# Patient Record
Sex: Male | Born: 1955
Health system: Southern US, Community
[De-identification: ages and names within clinical notes are randomized; demographics above are authoritative.]

## PROBLEM LIST (undated history)

## (undated) DIAGNOSIS — T4145XA Adverse effect of unspecified anesthetic, initial encounter: Secondary | ICD-10-CM

## (undated) DIAGNOSIS — T8859XA Other complications of anesthesia, initial encounter: Secondary | ICD-10-CM

## (undated) DIAGNOSIS — E119 Type 2 diabetes mellitus without complications: Secondary | ICD-10-CM

## (undated) DIAGNOSIS — I10 Essential (primary) hypertension: Secondary | ICD-10-CM

## (undated) HISTORY — PX: BACK SURGERY: SHX140

## (undated) HISTORY — PX: JOINT REPLACEMENT: SHX530

## (undated) HISTORY — PX: ELBOW SURGERY: SHX618

---
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
## 2011-03-16 ENCOUNTER — Encounter (HOSPITAL_COMMUNITY): Payer: Self-pay

## 2011-03-16 ENCOUNTER — Emergency Department (HOSPITAL_COMMUNITY)
Admission: EM | Admit: 2011-03-16 | Discharge: 2011-03-16 | Disposition: A | Discharge status: Home / Self Care | Payer: Medicare Other | Attending: Emergency Medicine | Admitting: Emergency Medicine

## 2011-03-16 DIAGNOSIS — R21 Rash and other nonspecific skin eruption: Secondary | ICD-10-CM | POA: Insufficient documentation

## 2011-03-16 MED ORDER — FLUOCINONIDE 0.05 % EX CREA: TOPICAL_CREAM | Freq: Two times a day (BID) | CUTANEOUS | Status: DC

## 2011-03-16 NOTE — ED Notes (Signed)
Pt c/o rash on nose and right side of face x years, on and off. No drainage, burning, and itchy.

## 2011-03-16 NOTE — ED Notes (Signed)
Pt in from home with rash to face states no relief with medication prescribed c/o burning and itching

## 2011-03-16 NOTE — ED Provider Notes (Signed)
History     CSN: [id number]  Arrival date & time 03/16/11  1302   First MD Initiated Contact with Patient 03/16/11 1355      Chief Complaint  Patient presents with  . Rash    (Consider location/radiation/quality/duration/timing/severity/associated sxs/prior treatment) HPI Comments: Patient reports that he has had similar rash in the past and has been seen by Dermatology in [place], [place].  At that time he was given fluocinomide topical solution, which he reports worked well.  However, he ran out of the solution and would like to have it refilled.  Patient is a 56 y.o. male presenting with rash. The history is provided by the patient.  Rash  This is a recurrent problem. There has been no fever. The rash is present on the face. The patient is experiencing no pain. Pertinent negatives include no blisters, no pain and no weeping.    History reviewed. No pertinent past medical history.  Past Surgical History  Procedure Date  . Back surgery   . Joint replacement     History reviewed. No pertinent family history.  History  Substance Use Topics  . Smoking status: Never Smoker   . Smokeless tobacco: Not on file  . Alcohol Use: No      Review of Systems  Constitutional: Negative for fever, chills and fatigue.  Respiratory: Negative for shortness of breath.   Cardiovascular: Negative for chest pain.  Gastrointestinal: Negative for nausea and vomiting.  Skin: Positive for rash.  Neurological: Negative for headaches.    Allergies  Review of patient's allergies indicates not on file.  Home Medications   Current Outpatient Rx  Name Route Sig Dispense Refill  . IBUPROFEN 200 MG PO TABS Oral Take 800 mg by mouth every 6 (six) hours as needed. For pain.      BP 128/76  Pulse 87  Temp(Src) 98.9 F (37.2 C) (Oral)  Resp 16  SpO2 100%  Physical Exam  Nursing note and vitals reviewed. Constitutional: He is oriented to person, place, and time. He appears  well-developed and well-nourished. No distress.  HENT:  Head: Normocephalic and atraumatic.       Hyperpigmented areas on the nose and inferior to both eyes.  Cardiovascular: Normal rate, regular rhythm and normal heart sounds.   Pulmonary/Chest: Effort normal and breath sounds normal.  Neurological: He is alert and oriented to person, place, and time.  Skin: Skin is warm and dry. He is not diaphoretic.  Psychiatric: He has a normal mood and affect.    ED Course  Procedures (including critical care time)  Labs Reviewed - No data to display No results found.   No diagnosis found.    MDM  Patient had been evaluated for similar rash in the past by Dermatology.  He was given Fluocinomide topical solution, which helped.  He reports that the rash today is no different than the rash in the past.  Therefore, patient discharged home and given Rx for Fluocinomide.        Pascal Lux [facility] 03/16/11 1622

## 2011-03-17 NOTE — ED Provider Notes (Signed)
Medical screening examination/treatment/procedure(s) were performed by non-physician practitioner and as supervising physician I was immediately available for consultation/collaboration.   Suzi Roots, MD 03/17/11 [phone]

## 2011-07-06 ENCOUNTER — Emergency Department (HOSPITAL_COMMUNITY)
Admission: EM | Admit: 2011-07-06 | Discharge: 2011-07-06 | Disposition: A | Discharge status: Home / Self Care | Payer: Medicare Other | Attending: Emergency Medicine | Admitting: Emergency Medicine

## 2011-07-06 ENCOUNTER — Encounter (HOSPITAL_COMMUNITY): Payer: Self-pay | Admitting: Emergency Medicine

## 2011-07-06 DIAGNOSIS — K5289 Other specified noninfective gastroenteritis and colitis: Secondary | ICD-10-CM | POA: Insufficient documentation

## 2011-07-06 DIAGNOSIS — R1013 Epigastric pain: Secondary | ICD-10-CM | POA: Insufficient documentation

## 2011-07-06 DIAGNOSIS — R197 Diarrhea, unspecified: Secondary | ICD-10-CM | POA: Insufficient documentation

## 2011-07-06 DIAGNOSIS — R1110 Vomiting, unspecified: Secondary | ICD-10-CM | POA: Insufficient documentation

## 2011-07-06 DIAGNOSIS — K529 Noninfective gastroenteritis and colitis, unspecified: Secondary | ICD-10-CM

## 2011-07-06 LAB — DIFFERENTIAL
Basophils Absolute: 0 10*3/uL (ref 0.0–0.1)
Basophils Relative: 0 % (ref 0–1)
Lymphocytes Relative: 18 % (ref 12–46)
Monocytes Absolute: 1.1 10*3/uL — ABNORMAL HIGH (ref 0.1–1.0)
Neutro Abs: 8.9 10*3/uL — ABNORMAL HIGH (ref 1.7–7.7)
Neutrophils Relative %: 72 % (ref 43–77)

## 2011-07-06 LAB — COMPREHENSIVE METABOLIC PANEL
ALT: 24 U/L (ref 0–53)
AST: 35 U/L (ref 0–37)
Albumin: 4.3 g/dL (ref 3.5–5.2)
Alkaline Phosphatase: 72 U/L (ref 39–117)
CO2: 26 mEq/L (ref 19–32)
Chloride: 100 mEq/L (ref 96–112)
GFR calc non Af Amer: 63 mL/min — ABNORMAL LOW (ref >90)
Potassium: 3.5 mEq/L (ref 3.5–5.1)
Sodium: 137 mEq/L (ref 135–145)
Total Bilirubin: 0.4 mg/dL (ref 0.3–1.2)

## 2011-07-06 LAB — URINE MICROSCOPIC-ADD ON

## 2011-07-06 LAB — CBC
HCT: 44.2 % (ref 39.0–52.0)
Platelets: 239 10*3/uL (ref 150–400)
RDW: 14.4 % (ref 11.5–15.5)
WBC: 12.2 10*3/uL — ABNORMAL HIGH (ref 4.0–10.5)

## 2011-07-06 LAB — URINALYSIS, ROUTINE W REFLEX MICROSCOPIC
Glucose, UA: NEGATIVE mg/dL
Leukocytes, UA: NEGATIVE
Protein, ur: NEGATIVE mg/dL
pH: 6 (ref 5.0–8.0)

## 2011-07-06 LAB — GLUCOSE, CAPILLARY: Glucose-Capillary: 112 mg/dL — ABNORMAL HIGH (ref 70–99)

## 2011-07-06 MED ORDER — ONDANSETRON 8 MG PO TBDP: 8.0000 mg | ORAL_TABLET | Freq: Three times a day (TID) | ORAL | Status: AC | PRN

## 2011-07-06 MED ORDER — LOPERAMIDE HCL 2 MG PO CAPS: 2.0000 mg | ORAL_CAPSULE | Freq: Four times a day (QID) | ORAL | Status: AC | PRN

## 2011-07-06 MED ORDER — LOPERAMIDE HCL 2 MG PO CAPS
2.0000 mg | ORAL_CAPSULE | ORAL | Status: DC | PRN
  Filled 2011-07-06: qty 1

## 2011-07-06 MED ORDER — ONDANSETRON HCL 4 MG/2ML IJ SOLN
4.0000 mg | Freq: Once | INTRAMUSCULAR | Status: AC
  Administered 2011-07-06: 4 mg via INTRAVENOUS
  Filled 2011-07-06: qty 2

## 2011-07-06 MED ORDER — SODIUM CHLORIDE 0.9 % IV SOLN
Freq: Once | INTRAVENOUS | Status: AC
  Administered 2011-07-06: 22:00:00 via INTRAVENOUS

## 2011-07-06 MED ORDER — SODIUM CHLORIDE 0.9 % IV BOLUS (SEPSIS)
1000.0000 mL | Freq: Once | INTRAVENOUS | Status: AC
  Administered 2011-07-06: 1000 mL via INTRAVENOUS

## 2011-07-06 MED ORDER — MORPHINE SULFATE 4 MG/ML IJ SOLN
4.0000 mg | Freq: Once | INTRAMUSCULAR | Status: AC
  Administered 2011-07-06: 4 mg via INTRAVENOUS
  Filled 2011-07-06: qty 1

## 2011-07-06 NOTE — ED Notes (Signed)
Pt c/o NV that began tonight after dinner. Pt had syncopal episode in lobby of ED upon arrival. Pt did not have LOC or fall. Pt was sitting in wheelchair during episode. Pt's family states pt was stating "i feel weak" repeatedly in route to ED. Pt states his stomach is "burning" and denies headache, chest pain, or any other discomforts.

## 2011-07-06 NOTE — ED Provider Notes (Signed)
History     CSN: [id number]  Arrival date & time 07/06/11  2010   First MD Initiated Contact with Patient 07/06/11 2059      Chief Complaint  Patient presents with  . Emesis    (Consider location/radiation/quality/duration/timing/severity/associated sxs/prior treatment) Patient is a 56 y.o. male presenting with vomiting. The history is provided by the patient and the spouse.  Emesis    patient here with nonbilious vomiting watery diarrhea after eating dinner this evening at [employer]. No fever. There is no epigastric tenderness without radiation. No prior history of this. No medications taken prior to arrival. Nothing makes her symptoms better or worse. Denies any urinary symptoms  History reviewed. No pertinent past medical history.  Past Surgical History  Procedure Date  . Back surgery   . Joint replacement     History reviewed. No pertinent family history.  History  Substance Use Topics  . Smoking status: Never Smoker   . Smokeless tobacco: Not on file  . Alcohol Use: No      Review of Systems  Gastrointestinal: Positive for vomiting.  All other systems reviewed and are negative.    Allergies  Review of patient's allergies indicates no known allergies.  Home Medications   Current Outpatient Rx  Name Route Sig Dispense Refill  . RANITIDINE HCL 75 MG PO TABS Oral Take 75 mg by mouth daily as needed. Acid reducer      BP 139/87  Pulse 88  Resp 22  SpO2 97%  Physical Exam  Nursing note and vitals reviewed. Constitutional: He is oriented to person, place, and time. He appears well-developed and well-nourished.  Non-toxic appearance. No distress.  HENT:  Head: Normocephalic and atraumatic.  Eyes: Conjunctivae, EOM and lids are normal. Pupils are equal, round, and reactive to light.  Neck: Normal range of motion. Neck supple. No tracheal deviation present. No mass present.  Cardiovascular: Normal rate, regular rhythm and normal heart sounds.  Exam  reveals no gallop.   No murmur heard. Pulmonary/Chest: Effort normal and breath sounds normal. No stridor. No respiratory distress. He has no decreased breath sounds. He has no wheezes. He has no rhonchi. He has no rales.  Abdominal: Soft. Normal appearance and bowel sounds are normal. He exhibits no distension. There is tenderness in the epigastric area. There is no rigidity, no rebound, no guarding and no CVA tenderness.  Musculoskeletal: Normal range of motion. He exhibits no edema and no tenderness.  Neurological: He is alert and oriented to person, place, and time. He has normal strength. No cranial nerve deficit or sensory deficit. GCS eye subscore is 4. GCS verbal subscore is 5. GCS motor subscore is 6.  Skin: Skin is warm and dry. No abrasion and no rash noted.  Psychiatric: He has a normal mood and affect. His speech is normal and behavior is normal.    ED Course  Procedures (including critical care time)  Labs Reviewed  GLUCOSE, CAPILLARY - Abnormal; Notable for the following:    Glucose-Capillary 112 (*)    All other components within normal limits  CBC  DIFFERENTIAL  COMPREHENSIVE METABOLIC PANEL  LIPASE, BLOOD  URINALYSIS, ROUTINE W REFLEX MICROSCOPIC  URINE CULTURE   No results found.   No diagnosis found.    MDM  Patient given IV fluids antiemetics and antidiarrheals. Suspect the patient has food poisoning. Repeat abdominal exam is negative he'll be discharged home. Urine culture has been sent        Toy Baker, MD  07/06/11 2316 

## 2011-07-06 NOTE — Discharge Instructions (Signed)
Nausea and Vomiting  Nausea is a sick feeling that often comes before throwing up (vomiting). Vomiting is a reflex where stomach contents come out of your mouth. Vomiting can cause severe loss of body fluids (dehydration). Children and elderly adults can become dehydrated quickly, especially if they also have diarrhea. Nausea and vomiting are symptoms of a condition or disease. It is important to find the cause of your symptoms.  CAUSES    Direct irritation of the stomach lining. This irritation can result from increased acid production (gastroesophageal reflux disease), infection, food poisoning, taking certain medicines (such as nonsteroidal anti-inflammatory drugs), alcohol use, or tobacco use.   Signals from the brain.These signals could be caused by a headache, heat exposure, an inner ear disturbance, increased pressure in the brain from injury, infection, a tumor, or a concussion, pain, emotional stimulus, or metabolic problems.   An obstruction in the gastrointestinal tract (bowel obstruction).   Illnesses such as diabetes, hepatitis, gallbladder problems, appendicitis, kidney problems, cancer, sepsis, atypical symptoms of a heart attack, or eating disorders.   Medical treatments such as chemotherapy and radiation.   Receiving medicine that makes you sleep (general anesthetic) during surgery.  DIAGNOSIS  Your caregiver may ask for tests to be done if the problems do not improve after a few days. Tests may also be done if symptoms are severe or if the reason for the nausea and vomiting is not clear. Tests may include:   Urine tests.   Blood tests.   Stool tests.   Cultures (to look for evidence of infection).   X-rays or other imaging studies.  Test results can help your caregiver make decisions about treatment or the need for additional tests.  TREATMENT  You need to stay well hydrated. Drink frequently but in small amounts.You may wish to drink water, sports drinks, clear broth, or eat frozen  ice pops or gelatin dessert to help stay hydrated.When you eat, eating slowly may help prevent nausea.There are also some antinausea medicines that may help prevent nausea.  HOME CARE INSTRUCTIONS    Take all medicine as directed by your caregiver.   If you do not have an appetite, do not force yourself to eat. However, you must continue to drink fluids.   If you have an appetite, eat a normal diet unless your caregiver tells you differently.   Eat a variety of complex carbohydrates (rice, wheat, potatoes, bread), lean meats, yogurt, fruits, and vegetables.   Avoid high-fat foods because they are more difficult to digest.   Drink enough water and fluids to keep your urine clear or pale yellow.   If you are dehydrated, ask your caregiver for specific rehydration instructions. Signs of dehydration may include:   Severe thirst.   Dry lips and mouth.   Dizziness.   Dark urine.   Decreasing urine frequency and amount.   Confusion.   Rapid breathing or pulse.  SEEK IMMEDIATE MEDICAL CARE IF:    You have blood or brown flecks (like coffee grounds) in your vomit.   You have black or bloody stools.   You have a severe headache or stiff neck.   You are confused.   You have severe abdominal pain.   You have chest pain or trouble breathing.   You do not urinate at least once every 8 hours.   You develop cold or clammy skin.   You continue to vomit for longer than 24 to 48 hours.   You have a fever.  MAKE SURE YOU:      Understand these instructions.   Will watch your condition.   Will get help right away if you are not doing well or get worse.  Document Released: 02/18/2005 Document Revised: 02/07/2011 Document Reviewed: 07/18/2010  [employer] Patient Information 2012 [employer].

## 2011-07-08 LAB — URINE CULTURE
Colony Count: NO GROWTH
Culture  Setup Time: 201305050124
Culture: NO GROWTH

## 2013-05-05 ENCOUNTER — Emergency Department (HOSPITAL_COMMUNITY): Payer: Medicare Other

## 2013-05-05 ENCOUNTER — Encounter (HOSPITAL_COMMUNITY): Payer: Self-pay | Admitting: Emergency Medicine

## 2013-05-05 ENCOUNTER — Emergency Department (HOSPITAL_COMMUNITY)
Admission: EM | Admit: 2013-05-05 | Discharge: 2013-05-05 | Disposition: A | Discharge status: Home / Self Care | Payer: Medicare Other | Attending: Emergency Medicine | Admitting: Emergency Medicine

## 2013-05-05 DIAGNOSIS — R51 Headache: Secondary | ICD-10-CM | POA: Insufficient documentation

## 2013-05-05 DIAGNOSIS — Z87891 Personal history of nicotine dependence: Secondary | ICD-10-CM | POA: Insufficient documentation

## 2013-05-05 DIAGNOSIS — Z79899 Other long term (current) drug therapy: Secondary | ICD-10-CM | POA: Diagnosis not present

## 2013-05-05 DIAGNOSIS — R519 Headache, unspecified: Secondary | ICD-10-CM

## 2013-05-05 DIAGNOSIS — I10 Essential (primary) hypertension: Secondary | ICD-10-CM | POA: Insufficient documentation

## 2013-05-05 HISTORY — DX: Essential (primary) hypertension: I10

## 2013-05-05 LAB — CBC
HCT: 41.8 % (ref 39.0–52.0)
Hemoglobin: 14.4 g/dL (ref 13.0–17.0)
MCH: 31.6 pg (ref 26.0–34.0)
MCHC: 34.4 g/dL (ref 30.0–36.0)
MCV: 91.7 fL (ref 78.0–100.0)
PLATELETS: 243 10*3/uL (ref 150–400)
RBC: 4.56 MIL/uL (ref 4.22–5.81)
RDW: 14 % (ref 11.5–15.5)
WBC: 8.8 10*3/uL (ref 4.0–10.5)

## 2013-05-05 LAB — COMPREHENSIVE METABOLIC PANEL
ALT: 17 U/L (ref 0–53)
AST: 26 U/L (ref 0–37)
Albumin: 3.8 g/dL (ref 3.5–5.2)
Alkaline Phosphatase: 71 U/L (ref 39–117)
BUN: 12 mg/dL (ref 6–23)
CALCIUM: 9.1 mg/dL (ref 8.4–10.5)
CO2: 28 meq/L (ref 19–32)
Chloride: 102 mEq/L (ref 96–112)
Creatinine, Ser: 1.13 mg/dL (ref 0.50–1.35)
GFR calc Af Amer: 82 mL/min — ABNORMAL LOW (ref >90)
GFR calc non Af Amer: 71 mL/min — ABNORMAL LOW (ref >90)
Glucose, Bld: 85 mg/dL (ref 70–99)
Potassium: 4.2 mEq/L (ref 3.7–5.3)
SODIUM: 141 meq/L (ref 137–147)
TOTAL PROTEIN: 7.4 g/dL (ref 6.0–8.3)
Total Bilirubin: 0.5 mg/dL (ref 0.3–1.2)

## 2013-05-05 MED ORDER — LISINOPRIL-HYDROCHLOROTHIAZIDE 10-12.5 MG PO TABS: 1.0000 | ORAL_TABLET | Freq: Every day | ORAL | Status: DC

## 2013-05-05 NOTE — ED Notes (Addendum)
Pt reports left sided and posterior HA x x5 days. States he normally gets a HA with high BP, checked today and found to be 187/70. Neuro intact. AO x4. Pt denies any pain at this time. Hx: HTN, noncompliant with RX

## 2013-05-05 NOTE — ED Notes (Signed)
Patient just arrived to room.

## 2013-05-05 NOTE — ED Provider Notes (Signed)
CSN: [id number]     Arrival date & time 05/05/13  1706 History   First MD Initiated Contact with Patient 05/05/13 2006     Chief Complaint  Patient presents with  . Hypertension  . Headache     (Consider location/radiation/quality/duration/timing/severity/associated sxs/prior Treatment) The history is provided by the patient and medical records. No language interpreter was used.    Andre Rodriguez is a 58 y.o. male  with a hx of HTN (currently uncontrolled) presents to the Emergency Department complaining of intermittent left sided and posterior headaches onset 5 days ago.  Pt reports he often gets a headache when his BP is high.  His wife reports these headaches are worse during and after sexual intercourse, but he is not taking a phosphodiesterase inhibitor. He denies a personal or family history of stroke. He reports he has been getting the headaches off-and-on for 6 or more months but it became more persistent in the last 5 days. He has been taking Aleve with moderate improvement in the headaches but they do return. He reports his headache free at this time.   He reports he chronically has bad vision but has had no recent vision changes. Patient denies associated symptoms.  He specifically denies fever, chills, neck pain, neck stiffness, chest pain, shortness of breath abdominal pain, nausea, vomiting, diarrhea, weakness, dizziness, syncope, dysuria, hematuria, photophobia, phonophobia. He has no history of migraines. He reports he has not seen a primary care physician more than one year. He reports he had a history of hypertension for which he was taking medication but stopped more than one year ago because his sister told him to.   Past Medical History  Diagnosis Date  . Hypertension    Past Surgical History  Procedure Laterality Date  . Back surgery    . Joint replacement    . Knee surgery Left 1976   No family history on file. History  Substance Use Topics  . Smoking status: Former  Smoker    Quit date: 05/05/1996  . Smokeless tobacco: Not on file  . Alcohol Use: 9.0 oz/week    15 Cans of beer per week    Review of Systems  Constitutional: Negative for fever, diaphoresis, appetite change, fatigue and unexpected weight change.  HENT: Negative for mouth sores.   Eyes: Negative for visual disturbance.  Respiratory: Negative for cough, chest tightness, shortness of breath and wheezing.   Cardiovascular: Negative for chest pain.  Gastrointestinal: Negative for nausea, vomiting, abdominal pain, diarrhea and constipation.  Endocrine: Negative for polydipsia, polyphagia and polyuria.  Genitourinary: Negative for dysuria, urgency, frequency and hematuria.  Musculoskeletal: Negative for back pain and neck stiffness.  Skin: Negative for rash.  Allergic/Immunologic: Negative for immunocompromised state.  Neurological: Positive for headaches. Negative for syncope and light-headedness.  Hematological: Does not bruise/bleed easily.  Psychiatric/Behavioral: Negative for sleep disturbance. The patient is not nervous/anxious.       Allergies  Review of patient's allergies indicates no known allergies.  Home Medications   Current Outpatient Rx  Name  Route  Sig  Dispense  Refill  . lisinopril-hydrochlorothiazide (ZESTORETIC) 10-12.5 MG per tablet   Oral   Take 1 tablet by mouth daily.   30 tablet   0    BP 164/105  Pulse 70  Temp(Src) 98.2 F (36.8 C) (Oral)  Resp 18  Wt 267 lb (121.11 kg)  SpO2 97% Physical Exam  Nursing note and vitals reviewed. Constitutional: He is oriented to person, place, and time. He appears  well-developed and well-nourished. No distress.  Awake, alert, nontoxic appearance  HENT:  Head: Normocephalic and atraumatic.  Mouth/Throat: Oropharynx is clear and moist. No oropharyngeal exudate.  Eyes: Conjunctivae and EOM are normal. Pupils are equal, round, and reactive to light. No scleral icterus.  Neck: Normal range of motion and full  passive range of motion without pain. Neck supple. No spinous process tenderness and no muscular tenderness present. No rigidity. Normal range of motion present.  Full range of motion without pain No midline or paraspinal tenderness  Cardiovascular: Normal rate, regular rhythm, normal heart sounds and intact distal pulses.   No murmur heard. Pulmonary/Chest: Effort normal and breath sounds normal. No respiratory distress. He has no wheezes. He has no rales.  Abdominal: Soft. Bowel sounds are normal. He exhibits no distension and no mass. There is no tenderness. There is no rebound and no guarding.  Musculoskeletal: Normal range of motion. He exhibits no edema.  Lymphadenopathy:    He has no cervical adenopathy.  Neurological: He is alert and oriented to person, place, and time. He has normal reflexes. No cranial nerve deficit. He exhibits normal muscle tone. Coordination normal.  Speech is clear and goal oriented, follows commands Cranial nerves III - XII without deficit, no facial droop Normal strength in upper and lower extremities bilaterally, strong and equal grip strength Sensation normal to light and sharp touch Moves extremities without ataxia, coordination intact Normal finger to nose and rapid alternating movements Neg romberg, no pronator drift Normal gait Normal heel-shin and balance   Skin: Skin is warm and dry. No rash noted. He is not diaphoretic. No erythema.  Psychiatric: He has a normal mood and affect. His behavior is normal. Judgment and thought content normal.    ED Course  Procedures (including critical care time) Labs Review Labs Reviewed  COMPREHENSIVE METABOLIC PANEL - Abnormal; Notable for the following:    GFR calc non Af Amer 71 (*)    GFR calc Af Amer 82 (*)    All other components within normal limits  CBC   Imaging Review Ct Head Wo Contrast  05/05/2013   CLINICAL DATA:  Hypertension, headache  EXAM: CT HEAD WITHOUT CONTRAST  TECHNIQUE: Contiguous  axial images were obtained from the base of the skull through the vertex without intravenous contrast.  COMPARISON:  None.  FINDINGS: There is no acute intracranial hemorrhage or infarct. No mass lesion or midline shift. Gray-white matter differentiation is well maintained. Ventricles are normal in size without evidence of hydrocephalus. CSF containing spaces are within normal limits. No extra-axial fluid collection.  The calvarium is intact.  Orbital soft tissues are within normal limits.  There is partial opacification of the left anterior ethmoidal air cells. Otherwise, the paranasal sinuses and mastoid air cells are well pneumatized and free of fluid.  Scalp soft tissues are unremarkable.  IMPRESSION: No acute intracranial process.   Electronically Signed   By: Rise Mu M.D.   On: 05/05/2013 21:40     EKG Interpretation   Date/Time:  Wednesday May 05 2013 20:55:21 EST Ventricular Rate:  71 PR Interval:  203 QRS Duration: 88 QT Interval:  395 QTC Calculation: 429 R Axis:   74 Text Interpretation:  Sinus rhythm Borderline prolonged PR interval No  previous ECGs available Confirmed by Manus Gunning  MD, STEPHEN [phone]) on  05/05/2013 9:34:21 PM      MDM   Final diagnoses:  Headache  HTN (hypertension)    Alan Mulder presents with a hx of uncontrolled  HTN for the last year and intermittent headaches worse in the last 5 days.  Pt with normal neurologic exam here in the department, but no medical evaluation in the past several years.    Patient noted to be hypertensive in the emergency department.  No signs of hypertensive urgency. Head CT without acute abnormality.  ECG nonischemic.  Pt denies CP, SOB.  Discussed with patient the need for close follow-up and management by their primary care physician.   Pt without return of headache here in the department and without focal neurologic deficit on repeat exam.  CBC and CMP unremarkable.    Discussed with pt the need for close  follow-up.  Will begin him on Zestoretic (10-12.5) QD and have him follow up with a PCP provider in 2 weeks for repeat blood work.  Pt given resource guide and recommendations for PCP.    It has been determined that no acute conditions requiring further emergency intervention are present at this time. The patient/guardian have been advised of the diagnosis and plan. We have discussed signs and symptoms that warrant return to the ED, such as changes or worsening in symptoms.   Vital signs are stable at discharge.   BP 164/105  Pulse 70  Temp(Src) 98.2 F (36.8 C) (Oral)  Resp 18  Wt 267 lb (121.11 kg)  SpO2 97%  Patient/guardian has voiced understanding and agreed to follow-up with the PCP or specialist.       Dierdre ForthHannah Karsten Howry, PA-C 05/05/13 2251

## 2013-05-05 NOTE — Discharge Instructions (Signed)
1. Medications: Zestoretic for BP, usual home medications 2. Treatment: rest, drink plenty of fluids,  Take medications as prescribe 3. Follow Up: Please followup with your primary doctor for discussion of your diagnoses and further evaluation after today's visit; if you do not have a primary care doctor use the resource guide provided to find one;   Hypertension As your heart beats, it forces blood through your arteries. This force is your blood pressure. If the pressure is too high, it is called hypertension (HTN) or high blood pressure. HTN is dangerous because you may have it and not know it. High blood pressure may mean that your heart has to work harder to pump blood. Your arteries may be narrow or stiff. The extra work puts you at risk for heart disease, stroke, and other problems.  Blood pressure consists of two numbers, a higher number over a lower, 110/72, for example. It is stated as "110 over 72." The ideal is below 120 for the top number (systolic) and under 80 for the bottom (diastolic). Write down your blood pressure today. You should pay close attention to your blood pressure if you have certain conditions such as:  Heart failure.  Prior heart attack.  Diabetes  Chronic kidney disease.  Prior stroke.  Multiple risk factors for heart disease. To see if you have HTN, your blood pressure should be measured while you are seated with your arm held at the level of the heart. It should be measured at least twice. A one-time elevated blood pressure reading (especially in the Emergency Department) does not mean that you need treatment. There may be conditions in which the blood pressure is different between your right and left arms. It is important to see your caregiver soon for a recheck. Most people have essential hypertension which means that there is not a specific cause. This type of high blood pressure may be lowered by changing lifestyle factors such  as:  Stress.  Smoking.  Lack of exercise.  Excessive weight.  Drug/tobacco/alcohol use.  Eating less salt. Most people do not have symptoms from high blood pressure until it has caused damage to the body. Effective treatment can often prevent, delay or reduce that damage. TREATMENT  When a cause has been identified, treatment for high blood pressure is directed at the cause. There are a large number of medications to treat HTN. These fall into several categories, and your caregiver will help you select the medicines that are best for you. Medications may have side effects. You should review side effects with your caregiver. If your blood pressure stays high after you have made lifestyle changes or started on medicines,   Your medication(s) may need to be changed.  Other problems may need to be addressed.  Be certain you understand your prescriptions, and know how and when to take your medicine.  Be sure to follow up with your caregiver within the time frame advised (usually within two weeks) to have your blood pressure rechecked and to review your medications.  If you are taking more than one medicine to lower your blood pressure, make sure you know how and at what times they should be taken. Taking two medicines at the same time can result in blood pressure that is too low. SEEK IMMEDIATE MEDICAL CARE IF:  You develop a severe headache, blurred or changing vision, or confusion.  You have unusual weakness or numbness, or a faint feeling.  You have severe chest or abdominal pain, vomiting, or breathing problems.  MAKE SURE YOU:   Understand these instructions.  Will watch your condition.  Will get help right away if you are not doing well or get worse. Document Released: 02/18/2005 Document Revised: 05/13/2011 Document Reviewed: 10/09/2007 [facility] Patient Information 2014 [place], [place].    Emergency Department Resource Guide 1) Find a Doctor and Pay Out of  Pocket Although you won't have to find out who is covered by your insurance plan, it is a good idea to ask around and get recommendations. You will then need to call the office and see if the doctor you have chosen will accept you as a new patient and what types of options they offer for patients who are self-pay. Some doctors offer discounts or will set up payment plans for their patients who do not have insurance, but you will need to ask so you aren't surprised when you get to your appointment.  2) Contact Your Local Health Department Not all health departments have doctors that can see patients for sick visits, but many do, so it is worth a call to see if yours does. If you don't know where your local health department is, you can check in your phone book. The CDC also has a tool to help you locate your state's health department, and many state websites also have listings of all of their local health departments.  3) Find a Walk-in Clinic If your illness is not likely to be very severe or complicated, you may want to try a walk in clinic. These are popping up all over the country in pharmacies, drugstores, and shopping centers. They're usually staffed by nurse practitioners or physician assistants that have been trained to treat common illnesses and complaints. They're usually fairly quick and inexpensive. However, if you have serious medical issues or chronic medical problems, these are probably not your best option.  No Primary Care Doctor: - Call Health Connect at  [phone] - they can help you locate a primary care doctor that  accepts your insurance, provides certain services, etc. - Physician Referral Service- [phone]  Chronic Pain Problems: Organization         Address  Phone   Notes  [facility]  [phone] Patients need to be referred by their primary care doctor.   Medication Assistance: Organization         Address  Phone   Notes  [facility] [place] [place]  Medication [facility] [address] [place], [place] [address] [phone] --Must be a resident of [place] [facility] -- Must have NO insurance coverage whatsoever (no Medicaid/ Medicare, etc.) -- The pt. MUST have a primary care doctor that directs their care regularly and follows them in the community   Med[facility]ist  [phone]   [employer]  [phone]    Agencies that provide inexpensive medical care: Organization         Address  Phone   Notes  [facility]  [phone]   [facility]    [phone]) [phone]   [facility] [address] [place], [place] [address] [phone]   [facility] [address], [place] [phone]) [phone]   [facility]    [phone]   [facility]    [phone]) [phone]   [facility]  [address], [place] Phone:  [phone]) [phone], Fax:  [phone]) [phone] Hours of Operation:  9 am - 6 pm, M-F.  Also accepts Medicaid/Medicare and self-pay.  °[facility] ° [address], [place] Phone: [phone], Fax: [phone]. Hours of Operation:  8:30 am - 5:30 pm, M-F.  Also accepts Medicaid and self-pay.  °[facility] [place] [address], [place] Phone: [phone]) [phone]   °[facility] [address], [place], [place] ([phone], Ext. 123 Mondays & Thursdays: 7-9 AM.  First 15 patients are seen on a first come, first serve basis. °  ° °Medicaid-accepting [place] Providers: ° °Organization         Address  Phone   Notes  °[facility] [address], [place] [phone] Also accepts self-pay patients.  °[facility] [address], [place] ° ([phone]) [phone]   °[facility] [address], [place] [phone]) [phone]   °[facility] [address] [place]  [address], [place] ([phone]) [phone]   °Veita Bland [address], [place]  ° ([phone]) [phone] Only accepts [place] Access Medicaid patients after they have their name applied to their card.  ° °Self-Pay (no insurance) in [place]: ° °Organization         Address  Phone   Notes  °Sickle Cell Patients, [facility] [address], [place] [phone]) [phone]   °[facility] [address], [place] ([phone]) [phone]   °[facility] [place] ° [address], [place] ([phone]) [phone]   °[facility]/Dr. Osei-Bonsu ° [address] [place] [address], [place] or [address], [place] [phone]) [phone] Phone number for both [place] and [place] locations is the same.  °[facility] [address], [place] ([phone]) [phone]   °[facility] [address] [place] [address], [place] or [address] ([phone]) [phone] °(336) [phone]   °[facility] [address], [place] (336) [phone], phone; ([phone], fax Sees patients 1st and 3rd Saturday of every month.  Must not qualify for public or private insurance (i.e. Medicaid, Medicare, [place] Health Choice, Veterans' Benefits) • Household income should be no more than 200% of the poverty level •The clinic cannot treat you if you are pregnant or think you are pregnant • Sexually transmitted diseases are not treated at the clinic.  ° ° °Dental Care: °Organization         Address  Phone  Notes  °[place] [facility] [address], [place] ([phone]) [phone] Accepts children up to age 21 who are enrolled in Medicaid or [place] Health Choice; pregnant women with a Medicaid card; and children who have applied for Medicaid or [place] Health Choice, but were declined, whose parents can pay a reduced fee at time of service.  °[place] [facility] [place]  [address], [place]  (336) [phone] Accepts children up to age 21 who are enrolled in Medicaid or [place] Health Choice; pregnant women with a Medicaid card; and children who have applied for Medicaid or  Health Choice, but were declined, whose parents can pay a reduced fee at time of service.  °[facility] ° [address],  ([phone]) [phone] Patients are seen by appointment only. Walk-ins are not accepted. [facility] will see patients 18 years of age and older. °Monday - Tuesday (  8am-5pm) Most Wednesdays (8:30-5pm) $30 per visit, cash only  [facility]  [address], [facility] [phone] Patients are seen by appointment only. Walk-ins are not accepted. [place] will see patients 39 years of age and older. One Wednesday Evening (Monthly: Volunteer Based).  $30 per visit, cash only  [place]  [phone])[phone] for adults; Children under age 47, call Graduate Pediatric Dentistry at [phone]. Children aged 21-14, please call [phone] to request a pediatric application.  Dental services are provided in all areas of dental care including fillings, crowns and bridges, complete and partial dentures, implants, gum treatment, root canals, and extractions. Preventive care is also provided. Treatment is provided to both adults and children. Patients are selected via a lottery and there is often a waiting list.   [facility] [address], [place]  [phone] [email or web address]   [facility] [address] [place], [place] [phone], Ext. 123 Second and Fourth Thursday of each month, opens at 6:30 AM; Clinic ends at 9 AM.  Patients are seen on a first-come first-served basis, and a limited number are seen during each clinic.   [facility]  [address] Hillard Danker [place], [place] [phone]   Eligibility Requirements You must have lived in [place], [place], or [place]  counties for at least the last three months.   You cannot be eligible for state or federal sponsored [employer], including [employer], [place], or [employer].   You generally cannot be eligible for healthcare insurance through your employer.    How to apply: Eligibility screenings are held every Tuesday and Wednesday afternoon from 1:00 pm until 4:00 pm. You do not need an appointment for the interview!  [facility] [address], [place], [place]   [place]  [place]  [phone]    Behavioral Health Resources in the Community: Intensive Outpatient Programs Organization         Address  Phone  Notes  [place]. [address] [place], [place] [phone]   [facility] Outpatient [address] [place], [place]   [facility] [address], [place], [place]   [place] [address],  [place], [place] or [phone]   Substance Abuse Resources Organization         Address  Phone  Notes  [facility]  (813) [phone]   [place]  ([phone])[phone]   [facility] [place]   Chinita Pester  (972)488-4132   Residential & Outpatient Substance Abuse Program  760-264-8331   Psychological Services Organization         Address  Phone  Notes  [facility] [place]  [place]  [phone]   [place] [address] [place] or (613)[phone]    Mobile Crisis Teams Organization         Address  Phone  Notes  [facility], Mobile Crisis Care Unit  [phone]   [facility]  [address] [place], [place]   [place] [address] [place] [phone]    Self-Help/Support Groups Organization         Address  Phone  Notes  °[facility] [place] - variety of support groups  336- [phone] Call for more information  °Narcotics Anonymous (NA), [facility] [address], °[place]  2 meetings at this location  ° °Residential Treatment Programs °Organization         Address  Phone  Notes  °[facility] [address],    °[place]  1-866-801-8205   °[facility] ° [address], [place], [place] [phone]   °[facility] [address], [place] [phone] Admissions: 8am-3pm M-F  °[facility] [address]    °[place], [place] [phone]   °[facility] [address], [place], [place] [phone]   °[facility] [address]  °[place], [place] [phone]   °[facility] - Intensive Outpatient [address], [place], [place] 336-852-3033   °[facility] [address]  °[place], [place] 1-877-615-2722 or 336-784-9470   °[facility] ([facility]) [address] [place], [place] [phone] Accepts Medicaid  °[facility] [address]  °[place] 1-800-659-3381 Substance Abuse/Addiction Treatment  ° °[place] Behavioral Health Resources °Organization         Address  Phone  Notes  °[facility]  (888) 581-9988   °Julie Brannon, PhD [address] [place], [place]   (336) [phone] or (336) 951-0000   °[facility]   [address] °[place], [place] (336) 349-4454   °[facility] [address], [place], [place] (336) [phone] Insurance/Medicaid/sponsorship through [facility]  °[facility] [address]                                    [place], [place] (336) [phone] Therapy/tele-psych/case  °[facility] [address]  ° [place], [place] (336) 349-2233    °Dr. Arfeen  (336)  [phone]   °[facility] [place]  [employer] [place] [facility] 1) [address], [place] °2) [address], [place] °3)  [address], [place] (336) 349-3220 °(336) 342-7768 ° °(336) 342-8140   °[place] Child Abuse Hotline (336) 342-1394 or (336) 342-3537 (After Hours)    ° ° ° ° °

## 2013-05-06 NOTE — ED Provider Notes (Signed)
Medical screening examination/treatment/procedure(s) were performed by non-physician practitioner and as supervising physician I was immediately available for consultation/collaboration.   EKG Interpretation   Date/Time:  Wednesday May 05 2013 20:55:21 EST Ventricular Rate:  71 PR Interval:  203 QRS Duration: 88 QT Interval:  395 QTC Calculation: 429 R Axis:   74 Text Interpretation:  Sinus rhythm Borderline prolonged PR interval No  previous ECGs available Confirmed by Manus GunningANCOUR  MD, Keyaria Lawson [phone]) on  05/05/2013 9:34:21 PM       Glynn OctaveStephen Jushua Waltman, MD 05/06/13 [phone]

## 2013-11-07 ENCOUNTER — Encounter (HOSPITAL_COMMUNITY): Payer: Self-pay | Admitting: Emergency Medicine

## 2013-11-07 ENCOUNTER — Emergency Department (HOSPITAL_COMMUNITY)
Admission: EM | Admit: 2013-11-07 | Discharge: 2013-11-07 | Disposition: A | Discharge status: Home / Self Care | Payer: No Typology Code available for payment source | Attending: Emergency Medicine | Admitting: Emergency Medicine

## 2013-11-07 ENCOUNTER — Emergency Department (HOSPITAL_COMMUNITY): Payer: No Typology Code available for payment source

## 2013-11-07 DIAGNOSIS — I10 Essential (primary) hypertension: Secondary | ICD-10-CM | POA: Diagnosis not present

## 2013-11-07 DIAGNOSIS — Y9389 Activity, other specified: Secondary | ICD-10-CM | POA: Diagnosis not present

## 2013-11-07 DIAGNOSIS — Z9889 Other specified postprocedural states: Secondary | ICD-10-CM | POA: Diagnosis not present

## 2013-11-07 DIAGNOSIS — M5430 Sciatica, unspecified side: Secondary | ICD-10-CM | POA: Insufficient documentation

## 2013-11-07 DIAGNOSIS — Z981 Arthrodesis status: Secondary | ICD-10-CM | POA: Diagnosis not present

## 2013-11-07 DIAGNOSIS — Z87891 Personal history of nicotine dependence: Secondary | ICD-10-CM | POA: Diagnosis not present

## 2013-11-07 DIAGNOSIS — Y92410 Unspecified street and highway as the place of occurrence of the external cause: Secondary | ICD-10-CM | POA: Diagnosis not present

## 2013-11-07 DIAGNOSIS — IMO0002 Reserved for concepts with insufficient information to code with codable children: Secondary | ICD-10-CM | POA: Diagnosis present

## 2013-11-07 DIAGNOSIS — M5442 Lumbago with sciatica, left side: Secondary | ICD-10-CM

## 2013-11-07 LAB — URINALYSIS, ROUTINE W REFLEX MICROSCOPIC
BILIRUBIN URINE: NEGATIVE
GLUCOSE, UA: NEGATIVE mg/dL
HGB URINE DIPSTICK: NEGATIVE
Ketones, ur: NEGATIVE mg/dL
Leukocytes, UA: NEGATIVE
Nitrite: NEGATIVE
PH: 5.5 (ref 5.0–8.0)
Protein, ur: NEGATIVE mg/dL
SPECIFIC GRAVITY, URINE: 1.02 (ref 1.005–1.030)
Urobilinogen, UA: 0.2 mg/dL (ref 0.0–1.0)

## 2013-11-07 MED ORDER — OXYCODONE-ACETAMINOPHEN 5-325 MG PO TABS
1.0000 | ORAL_TABLET | Freq: Once | ORAL | Status: AC
  Administered 2013-11-07: 1 via ORAL
  Filled 2013-11-07: qty 1

## 2013-11-07 MED ORDER — OXYCODONE-ACETAMINOPHEN 5-325 MG PO TABS: 1.0000 | ORAL_TABLET | Freq: Four times a day (QID) | ORAL | Status: DC | PRN

## 2013-11-07 MED ORDER — LORAZEPAM 1 MG PO TABS
1.0000 mg | ORAL_TABLET | Freq: Once | ORAL | Status: AC
  Administered 2013-11-07: 1 mg via ORAL
  Filled 2013-11-07: qty 1

## 2013-11-07 NOTE — ED Notes (Signed)
32 ml in bladder after urination

## 2013-11-07 NOTE — Discharge Instructions (Signed)
Back Pain, Adult °Back pain is very common. The pain often gets better over time. The cause of back pain is usually not dangerous. Most people can learn to manage their back pain on their own.  °HOME CARE  °· Stay active. Start with short walks on flat ground if you can. Try to walk farther each day. °· Do not sit, drive, or stand in one place for more than 30 minutes. Do not stay in bed. °· Do not avoid exercise or work. Activity can help your back heal faster. °· Be careful when you bend or lift an object. Bend at your knees, keep the object close to you, and do not twist. °· Sleep on a firm mattress. Lie on your side, and bend your knees. If you lie on your back, put a pillow under your knees. °· Only take medicines as told by your doctor. °· Put ice on the injured area. °¨ Put ice in a plastic bag. °¨ Place a towel between your skin and the bag. °¨ Leave the ice on for 15-20 minutes, 03-04 times a day for the first 2 to 3 days. After that, you can switch between ice and heat packs. °· Ask your doctor about back exercises or massage. °· Avoid feeling anxious or stressed. Find good ways to deal with stress, such as exercise. °GET HELP RIGHT AWAY IF:  °· Your pain does not go away with rest or medicine. °· Your pain does not go away in 1 week. °· You have new problems. °· You do not feel well. °· The pain spreads into your legs. °· You cannot control when you poop (bowel movement) or pee (urinate). °· Your arms or legs feel weak or lose feeling (numbness). °· You feel sick to your stomach (nauseous) or throw up (vomit). °· You have belly (abdominal) pain. °· You feel like you may pass out (faint). °MAKE SURE YOU:  °· Understand these instructions. °· Will watch your condition. °· Will get help right away if you are not doing well or get worse. °Document Released: 08/07/2007 Document Revised: 05/13/2011 Document Reviewed: 06/22/2013 °ExitCare® Patient Information ©2015 [employer]. This information is not intended  to replace advice given to you by your health care provider. Make sure you discuss any questions you have with your health care provider. ° ° °Emergency Department Resource Guide °1) Find a Doctor and Pay Out of Pocket °Although you won't have to find out who is covered by your insurance plan, it is a good idea to ask around and get recommendations. You will then need to call the office and see if the doctor you have chosen will accept you as a new patient and what types of options they offer for patients who are self-pay. Some doctors offer discounts or will set up payment plans for their patients who do not have insurance, but you will need to ask so you aren't surprised when you get to your appointment. ° °2) Contact Your Local Health Department °Not all health departments have doctors that can see patients for sick visits, but many do, so it is worth a call to see if yours does. If you don't know where your local health department is, you can check in your phone book. The CDC also has a tool to help you locate your state's health department, and many state websites also have listings of all of their local health departments. ° °3) Find a Walk-in Clinic °If your illness is not likely to be   very severe or complicated, you may want to try a walk in clinic. These are popping up all over the country in pharmacies, drugstores, and shopping centers. They're usually staffed by nurse practitioners or physician assistants that have been trained to treat common illnesses and complaints. They're usually fairly quick and inexpensive. However, if you have serious medical issues or chronic medical problems, these are probably not your best option. ° °No Primary Care Doctor: °- Call [facility] at  832-8000 - they can help you locate a primary care doctor that  accepts your insurance, provides certain services, etc. °- Physician Referral Service- 1-800-533-3463 ° °Chronic Pain Problems: °Organization         Address  Phone    Notes  °[facility]  [phone] Patients need to be referred by their primary care doctor.  ° °Medication Assistance: °Organization         Address  Phone   Notes  °[place] [facility] [address] °[place], [place] [address] [phone]) [phone] --Must be a resident of [place] °-- Must have NO insurance coverage whatsoever (no Medicaid/ Medicare, etc.) °-- The pt. MUST have a primary care doctor that directs their care regularly and follows them in the community °  °[facility]  (866) 331-1348   °[employer]  (888) 892-1162   ° °Agencies that provide inexpensive medical care: °Organization         Address  Phone   Notes  °[facility]  ([phone]) [phone]   °[facility]    ([phone]) [phone]   °[facility] [address] °[place], [place] [address] [phone]) [phone]   °[facility] [address], °[place] ([phone]) [phone]   °[facility]    ([phone]) 373-0678   °[facility]    ([phone]) [phone]   °[facility] ° [address], [place] Phone:  ([phone]) [phone], Fax:  ([phone] Hours of Operation:  9 am - 6 pm, M-F.  Also accepts Medicaid/Medicare and self-pay.  °[facility] ° [address], [place] Phone: ([phone]) [phone], Fax: [phone]. Hours of Operation:  8:30 am - 5:30 pm, M-F.  Also accepts Medicaid and self-pay.  °[facility] [place] [address], [place] Phone: ([phone]) [phone]   °[facility] [address], [place], [place] (336)[phone], Ext. 123 Mondays & Thursdays: 7-9 AM.  First 15 patients are seen on a first come, first serve basis. °  ° °Medicaid-accepting [place] Providers: ° °Organization         Address  Phone   Notes  °[facility] [address], [place] ([phone] Also accepts self-pay patients.  °[facility]  [address], [place] ° ([phone]) [phone]   °[facility] [address], [place] ([phone]) [phone]   °[facility] [address] [place] [address], [place] ([phone]) [phone]   °[facility] [address], [place]  ° ([phone] Only accepts [place] Access Medicaid patients after they have their name applied to their card.  ° °Self-Pay (no insurance) in [place]: ° °Organization         Address  Phone   Notes  °Sickle Cell Patients, [facility] [address],  ([phone]   °[facility]   [place] [phone]   [facility] [address], [place] [phone]   [facility]/Dr. Osei-Bonsu  [address], [place] or [address], [place] [phone] Phone number for both [place] and [place] locations is the same.  [facility] [address] [place] [phone]   [facility] [address] [place] or [address] [phone]   [facility] [address], [place] [phone], phone; [phone], fax Sees patients 1st and 3rd Saturday of every month.  Must not qualify for public or private insurance (i.e. Medicaid, Medicare, [place] [employer] Choice, Veterans' Benefits)  Household income should be no more than 200% of the poverty level The clinic cannot treat you if you are pregnant or think you are pregnant  Sexually transmitted diseases are not treated at the clinic.

## 2013-11-07 NOTE — ED Notes (Signed)
Patient transported to MRI 
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
## 2016-02-21 ENCOUNTER — Emergency Department (HOSPITAL_BASED_OUTPATIENT_CLINIC_OR_DEPARTMENT_OTHER)
Admission: EM | Admit: 2016-02-21 | Discharge: 2016-02-21 | Disposition: A | Discharge status: Home / Self Care | Payer: Medicare Other | Attending: Emergency Medicine | Admitting: Emergency Medicine

## 2016-02-21 ENCOUNTER — Encounter (HOSPITAL_BASED_OUTPATIENT_CLINIC_OR_DEPARTMENT_OTHER): Payer: Self-pay | Admitting: Emergency Medicine

## 2016-02-21 DIAGNOSIS — E119 Type 2 diabetes mellitus without complications: Secondary | ICD-10-CM | POA: Diagnosis not present

## 2016-02-21 DIAGNOSIS — I10 Essential (primary) hypertension: Secondary | ICD-10-CM | POA: Insufficient documentation

## 2016-02-21 DIAGNOSIS — Z87891 Personal history of nicotine dependence: Secondary | ICD-10-CM | POA: Diagnosis not present

## 2016-02-21 DIAGNOSIS — R1110 Vomiting, unspecified: Secondary | ICD-10-CM

## 2016-02-21 DIAGNOSIS — K922 Gastrointestinal hemorrhage, unspecified: Secondary | ICD-10-CM | POA: Diagnosis not present

## 2016-02-21 DIAGNOSIS — IMO0002 Reserved for concepts with insufficient information to code with codable children: Secondary | ICD-10-CM

## 2016-02-21 DIAGNOSIS — R197 Diarrhea, unspecified: Secondary | ICD-10-CM

## 2016-02-21 HISTORY — DX: Type 2 diabetes mellitus without complications: E11.9

## 2016-02-21 LAB — COMPREHENSIVE METABOLIC PANEL
ALK PHOS: 49 U/L (ref 38–126)
ALT: 28 U/L (ref 17–63)
ANION GAP: 8 (ref 5–15)
AST: 38 U/L (ref 15–41)
Albumin: 3.7 g/dL (ref 3.5–5.0)
BILIRUBIN TOTAL: 1.1 mg/dL (ref 0.3–1.2)
BUN: 43 mg/dL — ABNORMAL HIGH (ref 6–20)
CALCIUM: 9.1 mg/dL (ref 8.9–10.3)
CO2: 21 mmol/L — ABNORMAL LOW (ref 22–32)
CREATININE: 1.12 mg/dL (ref 0.61–1.24)
Chloride: 107 mmol/L (ref 101–111)
GFR calc Af Amer: >60 mL/min (ref >60)
GFR calc non Af Amer: >60 mL/min (ref >60)
Glucose, Bld: 132 mg/dL — ABNORMAL HIGH (ref 65–99)
Potassium: 4.9 mmol/L (ref 3.5–5.1)
Sodium: 136 mmol/L (ref 135–145)
TOTAL PROTEIN: 7.2 g/dL (ref 6.5–8.1)

## 2016-02-21 LAB — CBC WITH DIFFERENTIAL/PLATELET
Basophils Absolute: 0 10*3/uL (ref 0.0–0.1)
Basophils Relative: 0 %
Eosinophils Absolute: 0.1 10*3/uL (ref 0.0–0.7)
Eosinophils Relative: 1 %
HEMATOCRIT: 38.9 % — AB (ref 39.0–52.0)
HEMOGLOBIN: 12.7 g/dL — AB (ref 13.0–17.0)
LYMPHS ABS: 2.2 10*3/uL (ref 0.7–4.0)
LYMPHS PCT: 20 %
MCH: 30.8 pg (ref 26.0–34.0)
MCHC: 32.6 g/dL (ref 30.0–36.0)
MCV: 94.4 fL (ref 78.0–100.0)
MONOS PCT: 7 %
Monocytes Absolute: 0.8 10*3/uL (ref 0.1–1.0)
NEUTROS PCT: 72 %
Neutro Abs: 8.1 10*3/uL — ABNORMAL HIGH (ref 1.7–7.7)
Platelets: 282 10*3/uL (ref 150–400)
RBC: 4.12 MIL/uL — ABNORMAL LOW (ref 4.22–5.81)
RDW: 13.7 % (ref 11.5–15.5)
WBC: 11.2 10*3/uL — ABNORMAL HIGH (ref 4.0–10.5)

## 2016-02-21 LAB — URINALYSIS, ROUTINE W REFLEX MICROSCOPIC
BILIRUBIN URINE: NEGATIVE
Glucose, UA: NEGATIVE mg/dL
Hgb urine dipstick: NEGATIVE
Ketones, ur: 15 mg/dL — AB
LEUKOCYTES UA: NEGATIVE
NITRITE: NEGATIVE
PH: 6 (ref 5.0–8.0)
Protein, ur: NEGATIVE mg/dL
SPECIFIC GRAVITY, URINE: 1.021 (ref 1.005–1.030)

## 2016-02-21 LAB — OCCULT BLOOD X 1 CARD TO LAB, STOOL: Fecal Occult Bld: POSITIVE — AB

## 2016-02-21 MED ORDER — SODIUM CHLORIDE 0.9 % IV BOLUS (SEPSIS)
1000.0000 mL | Freq: Once | INTRAVENOUS | Status: AC
  Administered 2016-02-21: 1000 mL via INTRAVENOUS

## 2016-02-21 MED ORDER — ONDANSETRON HCL 4 MG PO TABS: 4.0000 mg | ORAL_TABLET | Freq: Three times a day (TID) | ORAL | 0 refills | Status: DC | PRN

## 2016-02-21 MED FILL — ONDANSETRON HCL 4 MG TABLET: 4 | 4 days supply | Qty: 12 | Fill #0

## 2016-02-21 NOTE — ED Triage Notes (Addendum)
Pt reports black tarry stools and dark emesis last night and another episode this morning. Pt states this started after eating [employer]. Denies pain. Pt states he is not nauseated currently. Possible hx of stomach ulcer many years ago.

## 2016-02-21 NOTE — Discharge Instructions (Signed)
Please discontinue Mobic. Please avoid spicy foods, alcohol, cigarettes, ibuprofen, naproxyn and aspirin.

## 2016-02-21 NOTE — ED Provider Notes (Signed)
[facility] Provider Note   CSN: [id number] Arrival date & time: 02/21/16  1010     History   Chief Complaint Chief Complaint  Patient presents with  . Emesis    HPI Andre Rodriguez is a 60 y.o. male.  Patient had multiple black tarry stools and one episode of dark vomiting over last 12 hours. No abdominal pain or fevers. Has history of PUD. startd on mobic last week and ever since then has had nausea, upset stomach. No h/o GI bleed. No light headedness or dizziness aside from when he is actually vomiting. No chest pain, syncope, back pain, abdominal pain or urinary symptoms.       Past Medical History:  Diagnosis Date  . Diabetes mellitus without complication (HCC)   . Hypertension     There are no active problems to display for this patient.   Past Surgical History:  Procedure Laterality Date  . BACK SURGERY    . JOINT REPLACEMENT    . KNEE SURGERY Left 1976       Home Medications    Prior to Admission medications   Medication Sig Start Date End Date Taking? Authorizing Provider  metFORMIN (GLUCOPHAGE) 500 MG tablet Take by mouth 2 (two) times daily with a meal.   Yes Historical Provider, MD  ondansetron (ZOFRAN) 4 MG tablet Take 1 tablet (4 mg total) by mouth every 8 (eight) hours as needed for nausea or vomiting. 02/21/16   Marily MemosJason Maylin [place], MD  oxyCODONE-acetaminophen (PERCOCET/ROXICET) 5-325 MG per tablet Take 1 tablet by mouth every 6 (six) hours as needed for severe pain. 11/07/13   Sofie RowerMike Stengel, MD    Family History No family history on file.  Social History Social History  Substance Use Topics  . Smoking status: Former Smoker    Quit date: 05/05/1996  . Smokeless tobacco: Never Used  . Alcohol use 9.0 oz/week    15 Cans of beer per week     Allergies   Patient has no known allergies.   Review of Systems Review of Systems  All other systems reviewed and are negative.    Physical Exam Updated Vital Signs BP 141/86 (BP Location:  Left Arm)   Pulse 82   Temp 98.3 F (36.8 C) (Oral)   Resp 14   Ht 5\' 11"  (1.803 m)   Wt 240 lb (108.9 kg)   SpO2 98%   BMI 33.47 kg/m   Physical Exam  Constitutional: He appears well-developed and well-nourished.  HENT:  Head: Normocephalic and atraumatic.  Eyes: Conjunctivae and EOM are normal.  Neck: Normal range of motion.  Cardiovascular: Normal rate.   Slight tachycardia in triage, in the 90's on my exam.  Pulmonary/Chest: Effort normal. No respiratory distress. He has no wheezes.  Abdominal: Soft. He exhibits no distension.  Genitourinary: Prostate normal. Rectal exam shows guaiac positive stool.  Musculoskeletal: Normal range of motion.  Neurological: He is alert.  Nursing note and vitals reviewed.    ED Treatments / Results  Labs (all labs ordered are listed, but only abnormal results are displayed) Labs Reviewed  URINALYSIS, ROUTINE W REFLEX MICROSCOPIC - Abnormal; Notable for the following:       Result Value   Ketones, ur 15 (*)    All other components within normal limits  CBC WITH DIFFERENTIAL/PLATELET - Abnormal; Notable for the following:    WBC 11.2 (*)    RBC 4.12 (*)    Hemoglobin 12.7 (*)    HCT 38.9 (*)  Neutro Abs 8.1 (*)    All other components within normal limits  COMPREHENSIVE METABOLIC PANEL - Abnormal; Notable for the following:    CO2 21 (*)    Glucose, Bld 132 (*)    BUN 43 (*)    All other components within normal limits  OCCULT BLOOD X 1 CARD TO LAB, STOOL - Abnormal; Notable for the following:    Fecal Occult Bld POSITIVE (*)    All other components within normal limits  POC OCCULT BLOOD, ED    EKG  EKG Interpretation None       Radiology No results found.  Procedures Procedures (including critical care time)  Medications Ordered in ED Medications  sodium chloride 0.9 % bolus 1,000 mL (0 mLs Intravenous Stopped 02/21/16 1129)     Initial Impression / Assessment and Plan / ED Course  I have reviewed the triage  vital signs and the nursing notes.  Pertinent labs & imaging results that were available during my care of the patient were reviewed by me and considered in my medical decision making (see chart for details).  Clinical Course     Likely bleeding ulcer. Will stop mobic, continue pantoprazole. Follow u pwith PCP. Hemodynamically stable here, no s/s acute blood loss anemia. Plan for dc with pcp follow up, strict specific return precautions given.  Final Clinical Impressions(s) / ED Diagnoses   Final diagnoses:  Gastrointestinal hemorrhage, unspecified gastrointestinal hemorrhage type  Ulcer (HCC)  Vomiting and diarrhea    New Prescriptions Discharge Medication List as of 02/21/2016  2:24 PM    START taking these medications   Details  ondansetron (ZOFRAN) 4 MG tablet Take 1 tablet (4 mg total) by mouth every 8 (eight) hours as needed for nausea or vomiting., Starting Wed 02/21/2016, Print         Marily MemosJason Saarah Dewing, MD 02/22/16 [phone]

## 2016-02-22 ENCOUNTER — Inpatient Hospital Stay (HOSPITAL_COMMUNITY)
Admission: EM | Admit: 2016-02-22 | Discharge: 2016-02-23 | DRG: 378 | Disposition: A | Discharge status: Home / Self Care | Payer: Medicare Other | Attending: Internal Medicine | Admitting: Internal Medicine

## 2016-02-22 ENCOUNTER — Encounter (HOSPITAL_COMMUNITY): Payer: Self-pay | Admitting: Emergency Medicine

## 2016-02-22 ENCOUNTER — Emergency Department (HOSPITAL_COMMUNITY): Payer: Medicare Other | Admitting: Anesthesiology

## 2016-02-22 ENCOUNTER — Inpatient Hospital Stay (HOSPITAL_COMMUNITY): Payer: Medicare Other

## 2016-02-22 ENCOUNTER — Encounter (HOSPITAL_COMMUNITY)
Admission: EM | Disposition: A | Discharge status: Home / Self Care | Payer: Self-pay | Source: Home / Self Care | Attending: Family Medicine

## 2016-02-22 DIAGNOSIS — Z79899 Other long term (current) drug therapy: Secondary | ICD-10-CM

## 2016-02-22 DIAGNOSIS — T39395A Adverse effect of other nonsteroidal anti-inflammatory drugs [NSAID], initial encounter: Secondary | ICD-10-CM | POA: Diagnosis present

## 2016-02-22 DIAGNOSIS — K222 Esophageal obstruction: Secondary | ICD-10-CM | POA: Diagnosis present

## 2016-02-22 DIAGNOSIS — Z791 Long term (current) use of non-steroidal anti-inflammatories (NSAID): Secondary | ICD-10-CM | POA: Diagnosis not present

## 2016-02-22 DIAGNOSIS — K922 Gastrointestinal hemorrhage, unspecified: Secondary | ICD-10-CM | POA: Diagnosis present

## 2016-02-22 DIAGNOSIS — N400 Enlarged prostate without lower urinary tract symptoms: Secondary | ICD-10-CM | POA: Diagnosis not present

## 2016-02-22 DIAGNOSIS — K921 Melena: Principal | ICD-10-CM | POA: Diagnosis present

## 2016-02-22 DIAGNOSIS — Z87891 Personal history of nicotine dependence: Secondary | ICD-10-CM

## 2016-02-22 DIAGNOSIS — Z86010 Personal history of colonic polyps: Secondary | ICD-10-CM | POA: Diagnosis not present

## 2016-02-22 DIAGNOSIS — I10 Essential (primary) hypertension: Secondary | ICD-10-CM | POA: Diagnosis present

## 2016-02-22 DIAGNOSIS — E119 Type 2 diabetes mellitus without complications: Secondary | ICD-10-CM | POA: Diagnosis not present

## 2016-02-22 DIAGNOSIS — R05 Cough: Secondary | ICD-10-CM

## 2016-02-22 DIAGNOSIS — K319 Disease of stomach and duodenum, unspecified: Secondary | ICD-10-CM | POA: Diagnosis present

## 2016-02-22 DIAGNOSIS — Z7984 Long term (current) use of oral hypoglycemic drugs: Secondary | ICD-10-CM | POA: Diagnosis not present

## 2016-02-22 DIAGNOSIS — N179 Acute kidney failure, unspecified: Secondary | ICD-10-CM | POA: Diagnosis present

## 2016-02-22 DIAGNOSIS — R059 Cough, unspecified: Secondary | ICD-10-CM

## 2016-02-22 DIAGNOSIS — D62 Acute posthemorrhagic anemia: Secondary | ICD-10-CM | POA: Diagnosis not present

## 2016-02-22 DIAGNOSIS — Z8711 Personal history of peptic ulcer disease: Secondary | ICD-10-CM | POA: Diagnosis not present

## 2016-02-22 HISTORY — PX: ESOPHAGOGASTRODUODENOSCOPY (EGD) WITH PROPOFOL: SHX5813

## 2016-02-22 LAB — CBC WITH DIFFERENTIAL/PLATELET
Basophils Absolute: 0 10*3/uL (ref 0.0–0.1)
Basophils Relative: 0 %
Eosinophils Absolute: 0 10*3/uL (ref 0.0–0.7)
Eosinophils Relative: 0 %
HEMATOCRIT: 26.7 % — AB (ref 39.0–52.0)
HEMOGLOBIN: 9.1 g/dL — AB (ref 13.0–17.0)
LYMPHS ABS: 3.6 10*3/uL (ref 0.7–4.0)
LYMPHS PCT: 23 %
MCH: 31 pg (ref 26.0–34.0)
MCHC: 34.1 g/dL (ref 30.0–36.0)
MCV: 90.8 fL (ref 78.0–100.0)
MONOS PCT: 7 %
Monocytes Absolute: 1 10*3/uL (ref 0.1–1.0)
NEUTROS ABS: 11.2 10*3/uL — AB (ref 1.7–7.7)
NEUTROS PCT: 70 %
Platelets: 297 10*3/uL (ref 150–400)
RBC: 2.94 MIL/uL — ABNORMAL LOW (ref 4.22–5.81)
RDW: 14 % (ref 11.5–15.5)
WBC: 15.9 10*3/uL — ABNORMAL HIGH (ref 4.0–10.5)

## 2016-02-22 LAB — ABO/RH: ABO/RH(D): O POS

## 2016-02-22 LAB — BASIC METABOLIC PANEL
Anion gap: 8 (ref 5–15)
BUN: 63 mg/dL — AB (ref 6–20)
CHLORIDE: 107 mmol/L (ref 101–111)
CO2: 22 mmol/L (ref 22–32)
CREATININE: 1.62 mg/dL — AB (ref 0.61–1.24)
Calcium: 8.4 mg/dL — ABNORMAL LOW (ref 8.9–10.3)
GFR calc non Af Amer: 45 mL/min — ABNORMAL LOW (ref >60)
GFR, EST AFRICAN AMERICAN: 52 mL/min — AB (ref >60)
Glucose, Bld: 140 mg/dL — ABNORMAL HIGH (ref 65–99)
Potassium: 4.3 mmol/L (ref 3.5–5.1)
Sodium: 137 mmol/L (ref 135–145)

## 2016-02-22 LAB — GLUCOSE, CAPILLARY
GLUCOSE-CAPILLARY: 100 mg/dL — AB (ref 65–99)
GLUCOSE-CAPILLARY: 135 mg/dL — AB (ref 65–99)
Glucose-Capillary: 148 mg/dL — ABNORMAL HIGH (ref 65–99)

## 2016-02-22 LAB — PROTIME-INR
INR: 1.05
INR: 1.05
Prothrombin Time: 13.7 seconds (ref 11.4–15.2)
Prothrombin Time: 13.8 seconds (ref 11.4–15.2)

## 2016-02-22 LAB — PHOSPHORUS: Phosphorus: 3.3 mg/dL (ref 2.5–4.6)

## 2016-02-22 LAB — APTT: APTT: 23 s — AB (ref 24–36)

## 2016-02-22 LAB — MAGNESIUM: Magnesium: 2.1 mg/dL (ref 1.7–2.4)

## 2016-02-22 LAB — TROPONIN I: Troponin I: 0.03 ng/mL (ref <0.03)

## 2016-02-22 LAB — HEMOGLOBIN
Hemoglobin: 8.3 g/dL — ABNORMAL LOW (ref 13.0–17.0)
Hemoglobin: 8 g/dL — ABNORMAL LOW (ref 13.0–17.0)

## 2016-02-22 SURGERY — ESOPHAGOGASTRODUODENOSCOPY (EGD) WITH PROPOFOL
Anesthesia: Monitor Anesthesia Care

## 2016-02-22 MED ORDER — HYDRALAZINE HCL 20 MG/ML IJ SOLN: 10.0000 mg | Freq: Three times a day (TID) | INTRAMUSCULAR | Status: DC | PRN

## 2016-02-22 MED ORDER — PROPOFOL 10 MG/ML IV BOLUS
INTRAVENOUS | Status: DC | PRN
  Administered 2016-02-22: 20 mg via INTRAVENOUS

## 2016-02-22 MED ORDER — ONDANSETRON HCL 4 MG/2ML IJ SOLN
INTRAMUSCULAR | Status: DC | PRN
  Administered 2016-02-22: 4 mg via INTRAVENOUS

## 2016-02-22 MED ORDER — GUAIFENESIN-DM 100-10 MG/5ML PO SYRP
5.0000 mL | ORAL_SOLUTION | ORAL | Status: DC | PRN
  Administered 2016-02-22 – 2016-02-23 (×2): 5 mL via ORAL
  Filled 2016-02-22 (×2): qty 10

## 2016-02-22 MED ORDER — SALINE SPRAY 0.65 % NA SOLN
1.0000 | NASAL | Status: DC | PRN
  Filled 2016-02-22: qty 44

## 2016-02-22 MED ORDER — TAMSULOSIN HCL 0.4 MG PO CAPS
0.4000 mg | ORAL_CAPSULE | Freq: Every day | ORAL | Status: DC
  Administered 2016-02-23: 0.4 mg via ORAL
  Filled 2016-02-22: qty 1

## 2016-02-22 MED ORDER — TRAZODONE HCL 50 MG PO TABS: 25.0000 mg | ORAL_TABLET | Freq: Every evening | ORAL | Status: DC | PRN

## 2016-02-22 MED ORDER — ACETAMINOPHEN 650 MG RE SUPP: 650.0000 mg | Freq: Four times a day (QID) | RECTAL | Status: DC | PRN

## 2016-02-22 MED ORDER — ACETAMINOPHEN 325 MG PO TABS
650.0000 mg | ORAL_TABLET | Freq: Four times a day (QID) | ORAL | Status: DC | PRN
Start: 2016-02-22 — End: 2016-02-23

## 2016-02-22 MED ORDER — LACTATED RINGERS IV SOLN: INTRAVENOUS | Status: DC | PRN

## 2016-02-22 MED ORDER — PHENOL 1.4 % MT LIQD
1.0000 | OROMUCOSAL | Status: DC | PRN
  Filled 2016-02-22: qty 177

## 2016-02-22 MED ORDER — SODIUM CHLORIDE 0.9 % IV SOLN
8.0000 mg/h | INTRAVENOUS | Status: DC
  Filled 2016-02-22: qty 80

## 2016-02-22 MED ORDER — PANTOPRAZOLE SODIUM 40 MG IV SOLR: 40.0000 mg | Freq: Two times a day (BID) | INTRAVENOUS | Status: DC

## 2016-02-22 MED ORDER — PANTOPRAZOLE SODIUM 40 MG IV SOLR
40.0000 mg | Freq: Once | INTRAVENOUS | Status: AC
  Administered 2016-02-22: 40 mg via INTRAVENOUS
  Filled 2016-02-22: qty 40

## 2016-02-22 MED ORDER — ALUM & MAG HYDROXIDE-SIMETH 200-200-20 MG/5ML PO SUSP: 30.0000 mL | ORAL | Status: DC | PRN

## 2016-02-22 MED ORDER — LIDOCAINE 2% (20 MG/ML) 5 ML SYRINGE
INTRAMUSCULAR | Status: DC | PRN
  Administered 2016-02-22: 100 mg via INTRAVENOUS

## 2016-02-22 MED ORDER — ONDANSETRON HCL 4 MG/2ML IJ SOLN
INTRAMUSCULAR | Status: AC
  Filled 2016-02-22: qty 2

## 2016-02-22 MED ORDER — SODIUM CHLORIDE 0.9 % IV SOLN
INTRAVENOUS | Status: DC
  Administered 2016-02-22 – 2016-02-23 (×2): via INTRAVENOUS

## 2016-02-22 MED ORDER — PROPOFOL 10 MG/ML IV BOLUS
INTRAVENOUS | Status: AC
  Filled 2016-02-22: qty 40

## 2016-02-22 MED ORDER — SODIUM CHLORIDE 0.9 % IV SOLN
INTRAVENOUS | Status: DC | PRN
  Administered 2016-02-22: 14:00:00 via INTRAVENOUS

## 2016-02-22 MED ORDER — PANTOPRAZOLE SODIUM 40 MG IV SOLR: 40.0000 mg | Freq: Once | INTRAVENOUS | Status: DC

## 2016-02-22 MED ORDER — LIDOCAINE 2% (20 MG/ML) 5 ML SYRINGE
INTRAMUSCULAR | Status: AC
  Filled 2016-02-22: qty 5

## 2016-02-22 MED ORDER — SODIUM CHLORIDE 0.9 % IV BOLUS (SEPSIS)
1000.0000 mL | Freq: Once | INTRAVENOUS | Status: AC
  Administered 2016-02-22: 1000 mL via INTRAVENOUS

## 2016-02-22 MED ORDER — PROPOFOL 500 MG/50ML IV EMUL
INTRAVENOUS | Status: DC | PRN
  Administered 2016-02-22: 200 ug/kg/min via INTRAVENOUS

## 2016-02-22 MED ORDER — POLYVINYL ALCOHOL 1.4 % OP SOLN
1.0000 [drp] | OPHTHALMIC | Status: DC | PRN
  Filled 2016-02-22: qty 15

## 2016-02-22 MED ORDER — PANTOPRAZOLE SODIUM 40 MG PO TBEC
40.0000 mg | DELAYED_RELEASE_TABLET | Freq: Every day | ORAL | Status: DC
  Administered 2016-02-23: 40 mg via ORAL
  Filled 2016-02-22: qty 1

## 2016-02-22 MED ORDER — SODIUM CHLORIDE 0.9% FLUSH
3.0000 mL | Freq: Two times a day (BID) | INTRAVENOUS | Status: DC
  Administered 2016-02-23: 3 mL via INTRAVENOUS

## 2016-02-22 MED ORDER — INSULIN ASPART 100 UNIT/ML ~~LOC~~ SOLN
0.0000 [IU] | SUBCUTANEOUS | Status: DC
  Administered 2016-02-22: 3 [IU] via SUBCUTANEOUS
  Administered 2016-02-23: 2 [IU] via SUBCUTANEOUS
  Administered 2016-02-23: 3 [IU] via SUBCUTANEOUS

## 2016-02-22 SURGICAL SUPPLY — 14 items

## 2016-02-22 NOTE — ED Notes (Signed)
Patient transferred to endoscopy.

## 2016-02-22 NOTE — Transfer of Care (Signed)
Immediate Anesthesia Transfer of Care Note  Patient: Andre MulderJohn Rodriguez  Procedure(s) Performed: Procedure(s): ESOPHAGOGASTRODUODENOSCOPY (EGD) WITH PROPOFOL (N/A)  Patient Location: PACU  Anesthesia Type:MAC  Level of Consciousness:  sedated, patient cooperative and responds to stimulation  Airway & Oxygen Therapy:Patient Spontanous Breathing and Patient connected to face mask oxgen  Post-op Assessment:  Report given to PACU RN and Post -op Vital signs reviewed and stable  Post vital signs:  Reviewed and stable  Last Vitals:  Vitals:   02/22/16 1200 02/22/16 1350  BP: 136/85   Pulse: 106 97  Resp: 17 23  Temp: 36.7 C 36.8 C    Complications: No apparent anesthesia complications

## 2016-02-22 NOTE — Op Note (Signed)
[facility] Patient Name: Andre Rodriguez Procedure Date: 02/22/2016 MRN: [id number] Attending MD: Bernette Redbird , MD Date of Birth: 1955-12-10 CSN: [id number] Age: 60 Admit Type: Inpatient Procedure:                Upper GI endoscopy Indications:              Acute post hemorrhagic anemia, Melena Providers:                Bernette Redbird, MD, Omelia Blackwater RN, RN, Clearnce Sorrel, Technician Referring MD:              Medicines:                Monitored Anesthesia Care Complications:            No immediate complications. Estimated Blood Loss:     Estimated blood loss: none. Procedure:                Pre-Anesthesia Assessment:                           - Prior to the procedure, a History and Physical                            was performed, and patient medications and                            allergies were reviewed. The patient's tolerance of                            previous anesthesia was also reviewed. The risks                            and benefits of the procedure and the sedation                            options and risks were discussed with the patient.                            All questions were answered, and informed consent                            was obtained. Prior Anticoagulants: The patient has                            taken previous NSAID medication, last dose was 1                            day prior to procedure. ASA Grade Assessment: II -                            A patient with mild systemic disease. After  reviewing the risks and benefits, the patient was                            deemed in satisfactory condition to undergo the                            procedure.                           After obtaining informed consent, the endoscope was                            passed under direct vision. Throughout the                            procedure, the patient's blood pressure,  pulse, and                            oxygen saturations were monitored continuously. The                            [id number] ([id number]) scope was introduced through the                            mouth, and advanced to the second part of duodenum.                            The upper GI endoscopy was accomplished without                            difficulty. The patient tolerated the procedure                            well. Scope In: Scope Out: Findings:      A widely patent Schatzki ring (acquired) was found in the distal       esophagus.      The exam of the esophagus was otherwise normal.      There is no endoscopic evidence of Barrett's esophagus, esophagitis,       inflammation, varices or Mallory-Weiss tear in the entire esophagus.      A few, 2 to 12 mm non-bleeding erosions were found in the prepyloric       region of the stomach. There were no stigmata of recent bleeding.      No other significant abnormalities were identified in a careful       examination of the stomach.      There is no endoscopic evidence of inflammation, ulceration, varices,       angiodysplasia or mass in the entire examined stomach.      The cardia and gastric fundus were normal on retroflexion.      The examined duodenum was normal. Impression:               - No active bleeding or blood in the stomach at the  time of this procedure.                           - Widely patent Schatzki ring.                           - Non-bleeding erosive gastropathy, c/w NSAID                            exposure.                           - Normal examined duodenum.                           - No specimens collected. Moderate Sedation:      This patient was sedated with monitored anesthesia care, not moderate       sedation. Recommendation:           - No aspirin, ibuprofen, naproxen, or other                            non-steroidal anti-inflammatory drugs.                           -  Use Protonix (pantoprazole) 40 mg PO daily for 1                            month.                           - It is not clear whether the observed findings                            account for this patient's significant recent blood                            loss. By their current appearance, which is rather                            superficial and clean based, it seems unlikely that                            they would cause significant bleeding. However,it                            is possible that the abnormalities were more                            pronounced several days ago prior to initiation of                            PPI therapy, and that might more readily explain                            the patient's GI bleeding. The  other possibility is                            that the patient has bled from some alternative                            source, such as a downstream ulceration, so even                            though the currently-visualized lesions Place the                            patient had very little risk for further bleeding,                            I do feel he should be observed carefully overnight                            in the hospital, and maintained off nonsteroidal                            anti-inflammatory drug therapy in the near term.                           - Resume regular diet today. Procedure Code(s):        --- Professional ---                           [id number], Esophagogastroduodenoscopy, flexible,                            transoral; diagnostic, including collection of                            specimen(s) by brushing or washing, when performed                            (separate procedure) Diagnosis Code(s):        --- Professional ---                           D62, Acute posthemorrhagic anemia                           K92.1, Melena (includes Hematochezia) CPT copyright 2016 American Medical Association. All rights  reserved. The codes documented in this report are preliminary and upon coder review may  be revised to meet current compliance requirements. Bernette Redbirdobert Euva Rundell, MD 02/22/2016 2:42:45 PM This report has been signed electronically. Number of Addenda: 0

## 2016-02-22 NOTE — ED Notes (Signed)
Bed: WA02 Expected date:  Expected time:  Means of arrival:  Comments: 60 yo M AMS

## 2016-02-22 NOTE — ED Provider Notes (Signed)
[facility]-EMERGENCY DEPT Provider Note   CSN: [id number] Arrival date & time: 02/22/16  1152     History   Chief Complaint Chief Complaint  Patient presents with  . Rectal Bleeding    HPI Andre Rodriguez is a 60 y.o. male.  Patient with history of PUD, diabetes and hypertension presents with complaint of melanotic stools, increasing weakness and lightheadedness especially with standing. Patient was at work today and reportedly was 'drowsy'. A coworker called EMS. Patient states that he has continued to have black tarry stools and lightheadedness with standing. He states that he felt like he was going to pass out but did not syncopize. [facility] [place] yesterday and was started on a PPI. Hgb was 12 with stable vital signs yesterday. Was encouraged to follow-up with PCP. He had an episode of yesterday where he may have thrown up blood although this is minimal. No N/V today. Denies abdominal pain. No heavy NSAID use, was told to d/c Meloxicam yesterday. Drinks alcohol approximately 1x per week. No CP or SOB. The onset of this condition was acute. The course is gradually worsening. Aggravating factors: none. Alleviating factors: none.        Past Medical History:  Diagnosis Date  . Diabetes mellitus without complication (HCC)   . Hypertension     There are no active problems to display for this patient.   Past Surgical History:  Procedure Laterality Date  . BACK SURGERY    . JOINT REPLACEMENT    . KNEE SURGERY Left 1976       Home Medications    Prior to Admission medications   Medication Sig Start Date End Date Taking? Authorizing Provider  metFORMIN (GLUCOPHAGE) 500 MG tablet Take by mouth 2 (two) times daily with a meal.    Historical Provider, MD  ondansetron (ZOFRAN) 4 MG tablet Take 1 tablet (4 mg total) by mouth every 8 (eight) hours as needed for nausea or vomiting. 02/21/16   Marily MemosJason Mesner, MD  oxyCODONE-acetaminophen (PERCOCET/ROXICET) 5-325  MG per tablet Take 1 tablet by mouth every 6 (six) hours as needed for severe pain. 11/07/13   Sofie RowerMike Stengel, MD    Family History No family history on file.  Social History Social History  Substance Use Topics  . Smoking status: Former Smoker    Quit date: 05/05/1996  . Smokeless tobacco: Never Used  . Alcohol use 9.0 oz/week    15 Cans of beer per week     Allergies   Patient has no known allergies.   Review of Systems Review of Systems  Constitutional: Negative for fever.  HENT: Negative for rhinorrhea and sore throat.   Eyes: Negative for redness.  Respiratory: Negative for cough.   Cardiovascular: Negative for chest pain.  Gastrointestinal: Positive for blood in stool. Negative for abdominal pain, diarrhea, nausea and vomiting.  Genitourinary: Negative for dysuria.  Musculoskeletal: Negative for myalgias.  Skin: Negative for rash.  Neurological: Negative for headaches.     Physical Exam Updated Vital Signs BP 136/85 (BP Location: Right Arm)   Pulse 106   Temp 98.1 F (36.7 C) (Oral)   Resp 17   Ht 5\' 11"  (1.803 m)   Wt 108.9 kg   SpO2 98%   BMI 33.47 kg/m   Physical Exam  Constitutional: He appears well-developed and well-nourished.  HENT:  Head: Normocephalic and atraumatic.  Mouth/Throat: Oropharynx is clear and moist.  Eyes: Right eye exhibits no discharge. Left eye exhibits no discharge.  Mild conjunctival pallor.   Neck: Normal range of motion. Neck supple.  Cardiovascular: Regular rhythm and normal heart sounds.  Tachycardia present.   Mild tachycardia  Pulmonary/Chest: Effort normal and breath sounds normal. No respiratory distress.  Abdominal: Soft. He exhibits no mass. There is tenderness (Mild with palpation over RUQ laterally. ). There is no guarding.  Neurological: He is alert.  Skin: Skin is warm and dry.  Psychiatric: He has a normal mood and affect.  Nursing note and vitals reviewed.    ED Treatments / Results  Labs (all labs  ordered are listed, but only abnormal results are displayed) Labs Reviewed  CBC WITH DIFFERENTIAL/PLATELET - Abnormal; Notable for the following:       Result Value   WBC 15.9 (*)    RBC 2.94 (*)    Hemoglobin 9.1 (*)    HCT 26.7 (*)    Neutro Abs 11.2 (*)    All other components within normal limits  BASIC METABOLIC PANEL - Abnormal; Notable for the following:    Glucose, Bld 140 (*)    BUN 63 (*)    Creatinine, Ser 1.62 (*)    Calcium 8.4 (*)    GFR calc non Af Amer 45 (*)    GFR calc Af Amer 52 (*)    All other components within normal limits  PROTIME-INR  TYPE AND SCREEN  ABO/RH    Procedures Procedures (including critical care time)  Medications Ordered in ED Medications  pantoprazole (PROTONIX) injection 40 mg (not administered)  sodium chloride 0.9 % bolus 1,000 mL (not administered)     Initial Impression / Assessment and Plan / ED Course  I have reviewed the triage vital signs and the nursing notes.  Pertinent labs & imaging results that were available during my care of the patient were reviewed by me and considered in my medical decision making (see chart for details).  Clinical Course    Patient seen and examined. Work-up initiated. Medications ordered (Protonix).   Vital signs reviewed and are as follows: BP 136/85 (BP Location: Right Arm)   Pulse 106   Temp 98.1 F (36.7 C) (Oral)   Resp 17   Ht 5\' 11"  (1.803 m)   Wt 108.9 kg   SpO2 98%   BMI 33.47 kg/m   1:35 PM Pt discussed with and seen by Dr. Deretha EmoryZackowski. Pt to be admitted. Discussed with Dr. Matthias HughsBuccini who plans EGD today. Will discuss with hospitalist service.   1:57 PM Spoke with Dr. Melynda RippleHobbs who will admit. Patient is currently OTF at endoscopy. He will be moved to floor if bed available, otherwise back to ED.   Final Clinical Impressions(s) / ED Diagnoses   Final diagnoses:  Acute upper GI bleeding  Acute blood loss anemia  AKI (acute kidney injury) (HCC)   Admit.    New  Prescriptions New Prescriptions   No medications on file     Renne CriglerJoshua Arely Tinner, PA-C 02/22/16 1357    Vanetta MuldersScott Zackowski, MD 02/22/16 [id number]

## 2016-02-22 NOTE — Progress Notes (Signed)
Report confirmed by ED physician stating that patient had no complaints of shortness of breath, chest pain or dizziness noted upon admission to ED.

## 2016-02-22 NOTE — Anesthesia Preprocedure Evaluation (Signed)
Anesthesia Evaluation  Patient identified by MRN, date of birth, ID band Patient awake    Reviewed: Allergy & Precautions, NPO status , Patient's Chart, lab work & pertinent test results  Airway Mallampati: II  TM Distance: >3 FB Neck ROM: Full    Dental no notable dental hx.    Pulmonary neg pulmonary ROS, former smoker,    Pulmonary exam normal breath sounds clear to auscultation       Cardiovascular hypertension, Normal cardiovascular exam Rhythm:Regular Rate:Normal     Neuro/Psych negative neurological ROS  negative psych ROS   GI/Hepatic negative GI ROS, Neg liver ROS,   Endo/Other  diabetes  Renal/GU negative Renal ROS  negative genitourinary   Musculoskeletal negative musculoskeletal ROS (+)   Abdominal   Peds negative pediatric ROS (+)  Hematology  (+) anemia ,   Anesthesia Other Findings   Reproductive/Obstetrics negative OB ROS                             Anesthesia Physical Anesthesia Plan  ASA: II  Anesthesia Plan: MAC   Post-op Pain Management:    Induction: Intravenous  Airway Management Planned: Nasal Cannula  Additional Equipment:   Intra-op Plan:   Post-operative Plan:   Informed Consent: I have reviewed the patients History and Physical, chart, labs and discussed the procedure including the risks, benefits and alternatives for the proposed anesthesia with the patient or authorized representative who has indicated his/her understanding and acceptance.   Dental advisory given  Plan Discussed with: CRNA and Surgeon  Anesthesia Plan Comments:         Anesthesia Quick Evaluation

## 2016-02-22 NOTE — Consult Note (Signed)
Referring Provider:  Dr. Vanetta MuldersScott Zackowski [facility] Primary Care Physician:  No PCP Per Patient Primary Gastroenterologist:  None (unassigned)  Reason for Consultation:  Melena, anemia  HPI: Andre Rodriguez is a 60 y.o. male w/ a 2 d h/o melena w/ pre-syncope, following a 5 day course of Mobic.  Remote h/o "ulcer" as a child, but no h/o prior GIB, no h/o excessive EtOH, no ASA exposure.  Has had some recent dyspeptic sx.  Seen at ER yesterday, Mobic stopped, PPI started, but since then there has been a 3 gm drop in hgb.and pt almost passed out in the shower this morning.   Past Medical History:  Diagnosis Date  . Diabetes mellitus without complication (HCC)   . Hypertension     Past Surgical History:  Procedure Laterality Date  . BACK SURGERY    . JOINT REPLACEMENT    . KNEE SURGERY Left 1976    Prior to Admission medications   Medication Sig Start Date End Date Taking? Authorizing Provider  albuterol (PROVENTIL) (2.5 MG/3ML) 0.083% nebulizer solution Inhale 2.5 mg into the lungs every 4 (four) hours as needed for wheezing or shortness of breath. 02/14/16 02/13/17 Yes Historical Provider, MD  lisinopril (PRINIVIL,ZESTRIL) 20 MG tablet Take 20 mg by mouth daily. 10/12/15  Yes Historical Provider, MD  metFORMIN (GLUCOPHAGE) 1000 MG tablet Take 500 mg by mouth 2 (two) times daily. 12/11/15  Yes Historical Provider, MD  metFORMIN (GLUCOPHAGE) 500 MG tablet Take 500 mg by mouth 2 (two) times daily with a meal.    Yes Historical Provider, MD  ondansetron (ZOFRAN) 4 MG tablet Take 1 tablet (4 mg total) by mouth every 8 (eight) hours as needed for nausea or vomiting. 02/21/16  Yes Marily MemosJason Mesner, MD  simvastatin (ZOCOR) 20 MG tablet Take 20 mg by mouth every evening. 12/11/15 12/10/16 Yes Historical Provider, MD  tamsulosin (FLOMAX) 0.4 MG CAPS capsule Take 0.4 mg by mouth daily. 06/15/15 06/14/16 Yes Historical Provider, MD    Current Facility-Administered Medications  Medication Dose Route  Frequency Provider Last Rate Last Dose  . sodium chloride 0.9 % bolus 1,000 mL  1,000 mL Intravenous Once Renne CriglerJoshua Geiple, PA-C 1,000 mL/hr at 02/22/16 1333 1,000 mL at 02/22/16 1333    Allergies as of 02/22/2016 - Review Complete 02/22/2016  Allergen Reaction Noted  . Meloxicam Other (See Comments) 02/22/2016    History reviewed. No pertinent family history.  Social History   Social History  . Marital status: Single    Spouse name: N/A  . Number of children: N/A  . Years of education: N/A   Occupational History  . 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 Smoking status: Former Smoker    Quit date: 05/05/1996  . Smokeless tobacco: Never Used  . Alcohol use 9.0 oz/week    15 Cans of beer per week  . Drug use: No  . Sexual activity: Not on file   Other Topics Concern  . Not on file   Social History Narrative  . No narrative on file    Review of Systems: No CP, SOB, skin problems, joint problems, urinary sx.  Physical Exam: Vital signs in last 24 hours: Temp:  [98.1 F (36.7 C)-98.3 F (36.8 C)] 98.3 F (36.8 C) (12/21 1350) Pulse Rate:  [97-106] 97 (12/21 1350) Resp:  [17-23] 23 (12/21 1350) BP: (136)/(85) 136/85 (12/21 1200) SpO2:  [98 %-100 %] 100 % (12/21 1350) Weight:  [108.9 kg (240  lb)] 108.9 kg (240 lb) (12/21 1350)   General:   Alert,  Well-developed, well-nourished, pleasant and cooperative in NAD Head:  Normocephalic and atraumatic. Eyes:  Sclera clear, no icterus.    Mouth:   No ulcerations or lesions.  Oropharynx pink & moist. Neck:   No masses or thyromegaly. Lungs:  Good air movement.  Soft wheezes, crackles, or rhonchi. No evident respiratory distress. Heart:   Regular rate and rhythm; no murmurs, clicks, rubs,  or gallops. Abdomen:  Soft, nontender, nontympanitic, and nondistended. No masses, hepatosplenomegaly or ventral hernias noted, without bruits, guarding, or rebound.   Msk:   Symmetrical without gross  deformities. Extremities:   Without clubbing, cyanosis, or edema. Neurologic:  Alert and coherent;  grossly normal neurologically. Skin:  Intact without significant lesions or rashes.  Multiple tattoos.  Warm and well-perfused. Cervical Nodes:  No significant cervical adenopathy. Psych:   Alert and cooperative. Normal mood and affect.  Intake/Output from previous day: No intake/output data recorded. Intake/Output this shift: No intake/output data recorded.  Lab Results:  Recent Labs  02/21/16 1045 02/22/16 1243  WBC 11.2* 15.9*  HGB 12.7* 9.1*  HCT 38.9* 26.7*  PLT 282 297   BMET  Recent Labs  02/21/16 1045 02/22/16 1243  NA 136 137  K 4.9 4.3  CL 107 107  CO2 21* 22  GLUCOSE 132* 140*  BUN 43* 63*  CREATININE 1.12 1.62*  CALCIUM 9.1 8.4*   LFT  Recent Labs  02/21/16 1045  PROT 7.2  ALBUMIN 3.7  AST 38  ALT 28  ALKPHOS 49  BILITOT 1.1   PT/INR  Recent Labs  02/22/16 1243  LABPROT 13.7  INR 1.05    Studies/Results: No results found.  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 Ashby DawesNature, purpose, and risks reviewed with patient and wife at the bedside. Further management to depend on the endoscopic findings.   LOS: 0 days   Payden Bonus V  02/22/2016, 2:06 PM   Pager [phone] If no answer or after 5 PM call [phone]

## 2016-02-22 NOTE — ED Provider Notes (Signed)
  Medical screening examination/treatment/procedure(s) were conducted as a shared visit with non-physician practitioner(s) and myself.  I personally evaluated the patient during the encounter.   EKG Interpretation None      Patient seen by me along with the physician assistant. Patient's had 2 days history of black tarry bowel movements. Past history of stomach ulcers but not currently under treatment. Patient was seen on December [facility] at [facility] [place]. Hemoglobin at that time was stable vital signs were stable. Patient returns with persistent black tarry stools. Today's full tachycardic and hemoglobin has not dropped significantly from 12 down to 9. Patient will require admission. Discussed with GI medicine they're going to go ahead and scope him today to be an upper endoscopy.  Patient's heart and lungs: Being a little tachycardic lungs are clear bilaterally. Abdomen is soft and nontender. Nose significant abdominal pain subjectively either.  Results for orders placed or performed during the hospital encounter of 02/22/16  CBC with Differential/Platelet  Result Value Ref Range   WBC 15.9 (H) 4.0 - 10.5 K/uL   RBC 2.94 (L) 4.22 - 5.81 MIL/uL   Hemoglobin 9.1 (L) 13.0 - 17.0 g/dL   HCT 47.826.7 (L) 29.539.0 - 62.152.0 %   MCV 90.8 78.0 - 100.0 fL   MCH 31.0 26.0 - 34.0 pg   MCHC 34.1 30.0 - 36.0 g/dL   RDW 30.814.0 65.711.5 - 84.615.5 %   Platelets 297 150 - 400 K/uL   Neutrophils Relative % 70 %   Neutro Abs 11.2 (H) 1.7 - 7.7 K/uL   Lymphocytes Relative 23 %   Lymphs Abs 3.6 0.7 - 4.0 K/uL   Monocytes Relative 7 %   Monocytes Absolute 1.0 0.1 - 1.0 K/uL   Eosinophils Relative 0 %   Eosinophils Absolute 0.0 0.0 - 0.7 K/uL   Basophils Relative 0 %   Basophils Absolute 0.0 0.0 - 0.1 K/uL  Basic metabolic panel  Result Value Ref Range   Sodium 137 135 - 145 mmol/L   Potassium 4.3 3.5 - 5.1 mmol/L   Chloride 107 101 - 111 mmol/L   CO2 22 22 - 32 mmol/L   Glucose, Bld 140 (H) 65 - 99 mg/dL   BUN 63  (H) 6 - 20 mg/dL   Creatinine, Ser 9.621.62 (H) 0.61 - 1.24 mg/dL   Calcium 8.4 (L) 8.9 - 10.3 mg/dL   GFR calc non Af Amer 45 (L) >60 mL/min   GFR calc Af Amer 52 (L) >60 mL/min   Anion gap 8 5 - 15  Protime-INR  Result Value Ref Range   Prothrombin Time 13.7 11.4 - 15.2 seconds   INR 1.05   Type and screen  Result Value Ref Range   ABO/RH(D) O POS    Antibody Screen PENDING    Sample Expiration 02/25/2016   ABO/Rh  Result Value Ref Range   ABO/RH(D) O POS       Vanetta MuldersScott Reinhart Saulters, MD 02/22/16 1332

## 2016-02-22 NOTE — ED Notes (Signed)
Nurse is in the room collecting labs 

## 2016-02-22 NOTE — Progress Notes (Signed)
Patient's endoscopy showed antral gastropathy consistent with NSAID exposure, but no clear source of recent bleeding, since the erosions were clean-based, and superficial.   My best judgment is that this patient did bleed from the stomach, and that the lesions we are observing now were more pronounced several days ago when the bleeding probably actually occurred.  However, it is noted that his BUN today is actually higher than it was yesterday when he was initially seen in the emergency room, and there has been a 3 g drop in hemoglobin overnight,  which suggests that more recent bleeding has occurred, despite the unimpressive endoscopic findings. This raises the question of a possible downstream lesion. Accordingly, I think in-house observation overnight is appropriate.  I have ordered a regular diet for the patient and have started him on oral PPI therapy. I have also ordered follow-up labs tomorrow morning (CBC and BMET). If everything goes well overnight, discharge tomorrow may well be appropriate, with follow-up in our office in the near future to check Hemoccult status, obtain an updated hemoglobin, and to determine whether further evaluation is needed.  It should be kept in mind that the patient indicates he had [place] colon[place] about [place] which showed (by his report) several polyps, and a 5 year follow-up exam was recommended.  If the patient should show signs of active further bleeding, I would consider obtaining a bleeding scan to try to localize the source within the GI tract.  Florencia Reasonsobert V. Karalyne Nusser, M.D. Pager [phone] If no answer or after 5 PM call [phone]

## 2016-02-22 NOTE — H&P (Signed)
[facility] History and Physical  Andre MulderJohn Rodriguez WUJ:[id number] DOB: 03/12/1955 DOA: 02/22/2016  Referring physician: PCP: No PCP Per Patient   Chief Complaint: "I had to sit down to pee because I kept getting dizzy."  HPI: Andre Rodriguez is a 60 y.o. male  with past medical history of peptic ulcer disease, hypertension and diabetes presented for black tarry stools. Patient states that he was started on meloxicam a few days ago by his primary care doctor for pain control. Patient then began to develop black tarry stools and dark bloody vomiting on the 20th. He was seen at [facility] emergency room and was discharged home. Patient's symptoms continued so he came to the emergency room for reevaluation. Primarily for concerns of dizziness when trying to stand. Patient did not black out but felt like he might.  ED course: In the emergency room patient was found to have a significant drop in his hemoglobin. He was given 2 L of fluid. Gastroenterology was consult did and patient was taken to the endoscopy suite. Type and screen was performed. Patient was also given a dose of Protonix.   Review of Systems:  As per HPI otherwise 10 point review of systems negative.    Past Medical History:  Diagnosis Date  . Diabetes mellitus without complication (HCC)   . Hypertension    Past Surgical History:  Procedure Laterality Date  . BACK SURGERY    . JOINT REPLACEMENT    . KNEE SURGERY Left 1976   Social History:  reports that he quit smoking about 19 years ago. He has never used smokeless tobacco. He reports that he drinks about 9.0 oz of alcohol per week . He reports that he does not use drugs.  Allergies  Allergen Reactions  . Meloxicam Other (See Comments)    Stomach ulcer/bleeding    History reviewed. No pertinent family history.   Prior to Admission medications   Medication Sig Start Date End Date Taking? Authorizing Provider  albuterol (PROVENTIL) (2.5 MG/3ML)  0.083% nebulizer solution Inhale 2.5 mg into the lungs every 4 (four) hours as needed for wheezing or shortness of breath. 02/14/16 02/13/17 Yes Historical Provider, MD  lisinopril (PRINIVIL,ZESTRIL) 20 MG tablet Take 20 mg by mouth daily. 10/12/15  Yes Historical Provider, MD  metFORMIN (GLUCOPHAGE) 1000 MG tablet Take 500 mg by mouth 2 (two) times daily. 12/11/15  Yes Historical Provider, MD  metFORMIN (GLUCOPHAGE) 500 MG tablet Take 500 mg by mouth 2 (two) times daily with a meal.    Yes Historical Provider, MD  ondansetron (ZOFRAN) 4 MG tablet Take 1 tablet (4 mg total) by mouth every 8 (eight) hours as needed for nausea or vomiting. 02/21/16  Yes Marily MemosJason Mesner, MD  simvastatin (ZOCOR) 20 MG tablet Take 20 mg by mouth every evening. 12/11/15 12/10/16 Yes Historical Provider, MD  tamsulosin (FLOMAX) 0.4 MG CAPS capsule Take 0.4 mg by mouth daily. 06/15/15 06/14/16 Yes Historical Provider, MD   Physical Exam: Vitals:   02/22/16 1200 02/22/16 1350  BP: 136/85   Pulse: 106 97  Resp: 17 23  Temp: 98.1 F (36.7 C) 98.3 F (36.8 C)  TempSrc: Oral Oral  SpO2: 98% 100%  Weight: 108.9 kg (240 lb) 108.9 kg (240 lb)  Height: 5\' 11"  (1.803 m) 5\' 11"  (1.803 m)    Wt Readings from Last 3 Encounters:  02/22/16 108.9 kg (240 lb)  02/21/16 108.9 kg (240 lb)  11/07/13 117.9 kg (260 lb)    General:  Appears calm  and comfortable Eyes:  PERRL, EOMI, normal lids, iris ENT:  grossly normal hearing, lips & tongue Neck:  no LAD, masses or thyromegaly Cardiovascular:  RRR, no m/r/g. No LE edema.  Respiratory:  CTA bilaterally, no w/r/r. Normal respiratory effort. Abdomen:  soft, ntnd Skin:  no rash or induration seen on limited exam Musculoskeletal:  grossly normal tone BUE/BLE Psychiatric:  grossly normal mood and affect, speech fluent and appropriate Neurologic:  CN 2-12 grossly intact, moves all extremities in coordinated fashion.          Labs on Admission:  Basic Metabolic Panel:  Recent  Labs Lab 02/21/16 1045 02/22/16 1243  NA 136 137  K 4.9 4.3  CL 107 107  CO2 21* 22  GLUCOSE 132* 140*  BUN 43* 63*  CREATININE 1.12 1.62*  CALCIUM 9.1 8.4*   Liver Function Tests:  Recent Labs Lab 02/21/16 1045  AST 38  ALT 28  ALKPHOS 49  BILITOT 1.1  PROT 7.2  ALBUMIN 3.7   No results for input(s): LIPASE, AMYLASE in the last 168 hours. No results for input(s): AMMONIA in the last 168 hours. CBC:  Recent Labs Lab 02/21/16 1045 02/22/16 1243  WBC 11.2* 15.9*  NEUTROABS 8.1* 11.2*  HGB 12.7* 9.1*  HCT 38.9* 26.7*  MCV 94.4 90.8  PLT 282 297   Cardiac Enzymes: No results for input(s): CKTOTAL, CKMB, CKMBINDEX, TROPONINI in the last 168 hours.  BNP (last 3 results) No results for input(s): BNP in the last 8760 hours.  ProBNP (last 3 results) No results for input(s): PROBNP in the last 8760 hours.   Serum creatinine: 1.62 mg/dL High 40/98/1112/21/17 91471243 Estimated creatinine clearance: 61.6 mL/min  CBG: No results for input(s): GLUCAP in the last 168 hours.  Radiological Exams on Admission: No results found.  EKG: ordered  Assessment/Plan Principal Problem:   Acute blood loss anemia Active Problems:   DM (diabetes mellitus) (HCC)   HTN (hypertension)   AKI (acute kidney injury) (HCC)   Acute GI bleeding   Acute blood loss anemia GI consulted in the emergency room, endoscopy to take place this afternoon Continue IV PPI Serial H&H Discussed risk and benefits of blood transfusion with patient, he is still pondering possible blood transfusion as an option No NSAIDs Diet per GI Rec, nothing by mouth for now except meds Fluids at100cc/hr, NS  Diabetes Sliding scale insulin  Hypertension When necessary hydralazine 10 mg IV as needed for severe blood pressure Hold ACEI due to AKI  AKI Baseline Cr nl range, Cr on admit 1.62 2000ml of normal saline given in the emergency room Gentle hydration overnight Checking magnesium and phosphorus Based on  BUN creatinine ratio this is prerenal which goes along with a history  BPH Cont flomax Check bladder scan  Code Status: Full code  DVT Prophylaxis: SCDs Family Communication: Wife at bedside Disposition Plan: Pending Improvement  Status: Inpatient telemetry  Haydee SalterPhillip M Nathanael Krist, MD Family Medicine [facility] [email or web address] Password [email or web address]

## 2016-02-22 NOTE — Anesthesia Postprocedure Evaluation (Signed)
Anesthesia Post Note  Patient: Andre MulderJohn Kraeger  Procedure(s) Performed: Procedure(s) (LRB): ESOPHAGOGASTRODUODENOSCOPY (EGD) WITH PROPOFOL (N/A)  Patient location during evaluation: PACU Anesthesia Type: MAC Level of consciousness: awake and alert Pain management: pain level controlled Vital Signs Assessment: post-procedure vital signs reviewed and stable Respiratory status: spontaneous breathing, nonlabored ventilation, respiratory function stable and patient connected to nasal cannula oxygen Cardiovascular status: stable and blood pressure returned to baseline Anesthetic complications: no       Last Vitals:  Vitals:   02/22/16 1350 02/22/16 1441  BP:  113/69  Pulse: 97 93  Resp: 23 19  Temp: 36.8 C 36.6 C    Last Pain:  Vitals:   02/22/16 1441  TempSrc: Oral  PainSc:                  Sheril Hammond S

## 2016-02-22 NOTE — ED Triage Notes (Signed)
Patient reports bleeding from rectal.  He was working and staff called stating patient had altered mental status.  When EMS arrived, patient was A & O x4.  Patient was seen 02-21-16 at [facility] and states the MD's did not address his concerns.     BP:  132/81 HR: 104 R:18  O2:98% on room air CBG: 166   EMS had Ortho static vital signs:  Standing:  100 palpated Sitting:  133/91`

## 2016-02-23 ENCOUNTER — Encounter (HOSPITAL_COMMUNITY): Payer: Self-pay | Admitting: Gastroenterology

## 2016-02-23 DIAGNOSIS — K921 Melena: Secondary | ICD-10-CM | POA: Diagnosis not present

## 2016-02-23 DIAGNOSIS — I10 Essential (primary) hypertension: Secondary | ICD-10-CM

## 2016-02-23 DIAGNOSIS — E119 Type 2 diabetes mellitus without complications: Secondary | ICD-10-CM

## 2016-02-23 DIAGNOSIS — D62 Acute posthemorrhagic anemia: Secondary | ICD-10-CM | POA: Diagnosis not present

## 2016-02-23 DIAGNOSIS — N179 Acute kidney failure, unspecified: Secondary | ICD-10-CM | POA: Diagnosis not present

## 2016-02-23 DIAGNOSIS — K922 Gastrointestinal hemorrhage, unspecified: Secondary | ICD-10-CM | POA: Diagnosis present

## 2016-02-23 LAB — CBC
HCT: 20.6 % — ABNORMAL LOW (ref 39.0–52.0)
Hemoglobin: 7.2 g/dL — ABNORMAL LOW (ref 13.0–17.0)
MCH: 31.7 pg (ref 26.0–34.0)
MCHC: 35 g/dL (ref 30.0–36.0)
MCV: 90.7 fL (ref 78.0–100.0)
PLATELETS: 206 10*3/uL (ref 150–400)
RBC: 2.27 MIL/uL — ABNORMAL LOW (ref 4.22–5.81)
RDW: 14.1 % (ref 11.5–15.5)
WBC: 11 10*3/uL — ABNORMAL HIGH (ref 4.0–10.5)

## 2016-02-23 LAB — BASIC METABOLIC PANEL
Anion gap: 4 — ABNORMAL LOW (ref 5–15)
BUN: 45 mg/dL — ABNORMAL HIGH (ref 6–20)
CHLORIDE: 110 mmol/L (ref 101–111)
CO2: 24 mmol/L (ref 22–32)
CREATININE: 1.41 mg/dL — AB (ref 0.61–1.24)
Calcium: 7.7 mg/dL — ABNORMAL LOW (ref 8.9–10.3)
GFR, EST NON AFRICAN AMERICAN: 53 mL/min — AB (ref >60)
Glucose, Bld: 113 mg/dL — ABNORMAL HIGH (ref 65–99)
POTASSIUM: 4.2 mmol/L (ref 3.5–5.1)
SODIUM: 138 mmol/L (ref 135–145)

## 2016-02-23 LAB — GLUCOSE, CAPILLARY
Glucose-Capillary: 132 mg/dL — ABNORMAL HIGH (ref 65–99)
Glucose-Capillary: 123 mg/dL — ABNORMAL HIGH (ref 65–99)
Glucose-Capillary: 120 mg/dL — ABNORMAL HIGH (ref 65–99)

## 2016-02-23 LAB — HEMOGLOBIN A1C
HEMOGLOBIN A1C: 5.7 % — AB (ref 4.8–5.6)
MEAN PLASMA GLUCOSE: 117 mg/dL

## 2016-02-23 LAB — PREPARE RBC (CROSSMATCH)

## 2016-02-23 LAB — HEMOGLOBIN: Hemoglobin: 7.3 g/dL — ABNORMAL LOW (ref 13.0–17.0)

## 2016-02-23 MED ORDER — SODIUM CHLORIDE 0.9 % IV SOLN
Freq: Once | INTRAVENOUS | Status: AC
  Administered 2016-02-23: 11:00:00 via INTRAVENOUS

## 2016-02-23 MED ORDER — PANTOPRAZOLE SODIUM 40 MG PO TBEC: 40.0000 mg | DELAYED_RELEASE_TABLET | Freq: Every day | ORAL | 0 refills | Status: DC

## 2016-02-23 MED ORDER — FERROUS SULFATE 325 (65 FE) MG PO TBEC: 325.0000 mg | DELAYED_RELEASE_TABLET | Freq: Every day | ORAL | 0 refills | Status: DC

## 2016-02-23 NOTE — Progress Notes (Signed)
Pt's vitals are WNL, tolerating diet, pain is under control, and there is not active bleeding. Discussed discharge instructions with both patient and wife. Discharged to home with prescriptions.

## 2016-02-23 NOTE — Progress Notes (Signed)
Subjective: No abdominal pain. No weakness or dizziness. No blood in stool.  Objective: Vital signs in last 24 hours: Temp:  [97.9 F (36.6 C)-98.8 F (37.1 C)] 98.6 F (37 C) (12/22 0510) Pulse Rate:  [85-106] 85 (12/22 0510) Resp:  [16-23] 16 (12/22 0510) BP: (113-151)/(67-85) 119/67 (12/22 0510) SpO2:  [98 %-100 %] 100 % (12/22 0510) Weight:  [108.9 kg (240 lb)] 108.9 kg (240 lb) (12/21 1350) Weight change:    PE: GEN:  NAD ABD:  Protuberant  Lab Results: CBC    Component Value Date/Time   WBC 11.0 (H) 02/23/2016 0539   RBC 2.27 (L) 02/23/2016 0539   HGB 7.2 (L) 02/23/2016 0539   HCT 20.6 (L) 02/23/2016 0539   PLT 206 02/23/2016 0539   MCV 90.7 02/23/2016 0539   MCH 31.7 02/23/2016 0539   MCHC 35.0 02/23/2016 0539   RDW 14.1 02/23/2016 0539   LYMPHSABS 3.6 02/22/2016 1243   MONOABS 1.0 02/22/2016 1243   EOSABS 0.0 02/22/2016 1243   BASOSABS 0.0 02/22/2016 1243   CMP     Component Value Date/Time   NA 138 02/23/2016 0539   K 4.2 02/23/2016 0539   CL 110 02/23/2016 0539   CO2 24 02/23/2016 0539   GLUCOSE 113 (H) 02/23/2016 0539   BUN 45 (H) 02/23/2016 0539   CREATININE 1.41 (H) 02/23/2016 0539   CALCIUM 7.7 (L) 02/23/2016 0539   PROT 7.2 02/21/2016 1045   ALBUMIN 3.7 02/21/2016 1045   AST 38 02/21/2016 1045   ALT 28 02/21/2016 1045   ALKPHOS 49 02/21/2016 1045   BILITOT 1.1 02/21/2016 1045   GFRNONAA 53 (L) 02/23/2016 0539   GFRAA >60 02/23/2016 0539   Assessment:  1.  Melena.  Erosions on endoscopy, unclear if this represents source. 2.  Acute blood loss anemia, interval decrease in Hgb 9-->7 overnight.  No symptoms.  Possible equilibrative changes. 3.  Personal history colonic polyps.  Last colonoscopy [place] about 2 years ago.  Plan:  1.  On regular diet already. 2.  Pantoprazole 40 mg po qd, now and upon discharge. 3.  Would consider short-term oral iron repletion (e.g., Nu-Iron 150 mg po qd), mindful however that this will turn patient's  stool black. 4.  Patient's hgb has dropped overnight, but he is asymptomatic and wants to go home.  I'm ok with this, so long as he gets repeat CBC early next week. 5.  [facility] (likely Dr. Matthias HughsBuccini) can follow-up with patient within the next 2-3 weeks. 6.  Will sign-off; please call with questions; thank you for the consultation.    Freddy JakschOUTLAW,Kashmere Daywalt M 02/23/2016, 8:42 AM   Pager [phone] If no answer or after 5 PM call [phone]

## 2016-02-23 NOTE — Discharge Summary (Signed)
Physician Discharge Summary  Andre Rodriguez ZOX:[id number] DOB: 1955/05/28 DOA: 02/22/2016  PCP: Deneen Harts, FNP  Admit date: 02/22/2016 Discharge date: 02/23/2016  Admitted From: Home  Disposition:  Home  Recommendations for Outpatient Follow-up:  1. Follow up with PCP on 12/26 or 12/27 for repeat CBC and BMP.  Please resume lisinopril if creatinine is back to baseline. 2. Follow-up with Dr. Matthias Hughs, gastroenterology in 2-3 weeks 3. Started on Protonix 40 mg once daily and iron  Home Health:  None  Equipment/Devices:  None   Discharge Condition:  Stable, improved CODE STATUS:  Full code  Diet recommendation:  Healthy heart   Brief/Interim Summary:  The patient is a 60 year old male with history of peptic ulcer disease, hypertension, and diabetes mellitus who presented with black tarry stools. He then started on meloxicam a few days prior to admission by his primary care doctor and he developed black tarry stools and dark bloody vomiting on 12/20. He was seen at [facility] and discharged home but he return to the emergency department because of persistent dizziness. He denied loss of consciousness. In the emergency department he was found to have a significant drop in his hemoglobin and he was given 2 L of IV fluids. Gastroenterology was consulted and he was taken to upper endoscopy on 12/21.  He had nonbleeding erosive gastropathy consistent with NSAID exposure but no obvious ulcers or active bleeding. He had a widely patent Schatzki ring that was incidentally noted. He was advised to start Protonix 40 mg once daily and avoid NSAIDs.    Discharge Diagnoses:  Principal Problem:   Acute blood loss anemia Active Problems:   DM (diabetes mellitus) (HCC)   HTN (hypertension)   AKI (acute kidney injury) (HCC)   Acute GI bleeding  Acute blood loss anemia likely due to upper GI bleed which was suggested by elevated BUN, possibly from NSAID induced gastropathy.  He was started on  IV PPI and taken to the endoscopy suite by Dr. Matthias Hughs, [facility], on 12/21 40 was found to have nonbleeding erosive gastropathy consistent with NSAID exposure. He was advised to no longer take NSAIDs and to take Protonix once daily.  He was transfused 1 unit of PRBC on 12/22 and started on ferrous sulfate as an outpatient. He started a repeat CBC done on 12/26 or 12/27 by his primary care doctor. He was advised to return to the emergency department if he has further shortness of breath, lightheadedness, dizziness, or fainting spells. He had no further evidence of obvious bleeding during his hospitalization.  Diabetes mellitus type 2, given sliding scale insulin. His metformin was held but resumed at discharge.  Hypertension, his lisinopril was held due to mild AKI.  Primary care doctor to repeat BMP next week and resume lisinopril if creatinine is back to baseline.  AKI, baseline creatinine of 1.1 and increased to 1.62 on 12/21.  His lisinopril was held and he was given IV fluids. His creatinine trended down to 1.4 prior to discharge.  BPH, stable, continue Flomax  Discharge Instructions  Discharge Instructions    Call MD for:  difficulty breathing, headache or visual disturbances    Complete by:  As directed    Call MD for:  extreme fatigue    Complete by:  As directed    Call MD for:  hives    Complete by:  As directed    Call MD for:  persistant dizziness or light-headedness    Complete by:  As directed  Call MD for:  persistant nausea and vomiting    Complete by:  As directed    Call MD for:  severe uncontrolled pain    Complete by:  As directed    Call MD for:  temperature >100.4    Complete by:  As directed    Diet - low sodium heart healthy    Complete by:  As directed    Discharge instructions    Complete by:  As directed    Please take protonix/pantoprazole once daily for the next month and avoid NSAIDS (improving, aspirin, Goody's powders).  Acetaminophen/Tylenol is  safe to take.  Please do not take alcohol. These take iron (ferrous sulfate) once daily to be aware that this medication can turn stools black and make it difficult to tell if your bleeding. Please return to the hospital if you have lightheadedness, dizziness, shortness of breath, severe fatigue, or fainting spells.  Have your primary care doctor check your blood counts early next week.   Increase activity slowly    Complete by:  As directed        Medication List    STOP taking these medications   lisinopril 20 MG tablet Commonly known as:  PRINIVIL,ZESTRIL     TAKE these medications   albuterol (2.5 MG/3ML) 0.083% nebulizer solution Commonly known as:  PROVENTIL Inhale 2.5 mg into the lungs every 4 (four) hours as needed for wheezing or shortness of breath.   ferrous sulfate 325 (65 FE) MG EC tablet Take 1 tablet (325 mg total) by mouth daily with breakfast.   metFORMIN 1000 MG tablet Commonly known as:  GLUCOPHAGE Take 500 mg by mouth 2 (two) times daily. What changed:  Another medication with the same name was removed. Continue taking this medication, and follow the directions you see here.   ondansetron 4 MG tablet Commonly known as:  ZOFRAN Take 1 tablet (4 mg total) by mouth every 8 (eight) hours as needed for nausea or vomiting.   pantoprazole 40 MG tablet Commonly known as:  PROTONIX Take 1 tablet (40 mg total) by mouth daily. Start taking on:  02/24/2016   simvastatin 20 MG tablet Commonly known as:  ZOCOR Take 20 mg by mouth every evening.   tamsulosin 0.4 MG Caps capsule Commonly known as:  FLOMAX Take 0.4 mg by mouth daily.      Follow-up Information    Florencia Reasons, MD. Schedule an appointment as soon as possible for a visit in 2 week(s).   Specialty:  Gastroenterology Contact information: [address] [place] [address] [phone]        [facility], FNP. Go on 02/27/2016.   Specialty:  Nurse Practitioner Why:  blood  test to check anemia Contact information: [address] [place] [address] [phone]          Allergies  Allergen Reactions  . Meloxicam Other (See Comments)    Stomach ulcer/bleeding    Consultations: [facility], Dr. Matthias Hughs   Procedures/Studies: Dg Chest Port 1 View  Result Date: 02/22/2016 CLINICAL DATA:  Productive cough with shortness of breath EXAM: PORTABLE CHEST 1 VIEW COMPARISON:  None. FINDINGS: The heart size and mediastinal contours are within normal limits. Both lungs are clear. The visualized skeletal structures are unremarkable. IMPRESSION: No active disease. Electronically Signed   By: Jasmine Pang M.D.   On: 02/22/2016 21:35    EGD on 12/21   Subjective: Would like to go home today. He denies lightheadedness,  shortness of breath. Concerned about getting a blood transfusion today. Worried about infection. Denies further emesis, hematemesis, bloody or melenic stools.  Discharge Exam: Vitals:   02/23/16 1056 02/23/16 1119  BP: (!) 125/48 (!) 136/53  Pulse: 90 87  Resp: 16 20  Temp: 98.3 F (36.8 C)    Vitals:   02/22/16 2142 02/23/16 0510 02/23/16 1056 02/23/16 1119  BP: (!) 151/77 119/67 (!) 125/48 (!) 136/53  Pulse: 91 85 90 87  Resp: 18 16 16 20   Temp: 98.8 F (37.1 C) 98.6 F (37 C) 98.3 F (36.8 C)   TempSrc: Oral Oral Oral Oral  SpO2: 99% 100% 100% 100%  Weight:      Height:        General: Pt is alert, awake, not in acute distress Cardiovascular: RRR, S1/S2 +, no rubs, no gallops Respiratory: CTA bilaterally, no wheezing, no rhonchi Abdominal: Soft, NT, ND, bowel sounds + Extremities: no edema, no cyanosis    The results of significant diagnostics from this hospitalization (including imaging, microbiology, ancillary and laboratory) are listed below for reference.     Microbiology: No results found for this or any previous visit (from the past 240 hour(s)).   Labs: BNP (last 3 results) No  results for input(s): BNP in the last 8760 hours. Basic Metabolic Panel:  Recent Labs Lab 02/21/16 1045 02/22/16 1243 02/22/16 1820 02/23/16 0539  NA 136 137  --  138  K 4.9 4.3  --  4.2  CL 107 107  --  110  CO2 21* 22  --  24  GLUCOSE 132* 140*  --  113*  BUN 43* 63*  --  45*  CREATININE 1.12 1.62*  --  1.41*  CALCIUM 9.1 8.4*  --  7.7*  MG  --   --  2.1  --   PHOS  --   --  3.3  --    Liver Function Tests:  Recent Labs Lab 02/21/16 1045  AST 38  ALT 28  ALKPHOS 49  BILITOT 1.1  PROT 7.2  ALBUMIN 3.7   No results for input(s): LIPASE, AMYLASE in the last 168 hours. No results for input(s): AMMONIA in the last 168 hours. CBC:  Recent Labs Lab 02/21/16 1045 02/22/16 1243 02/22/16 1820 02/22/16 2109 02/23/16 0143 02/23/16 0539  WBC 11.2* 15.9*  --   --   --  11.0*  NEUTROABS 8.1* 11.2*  --   --   --   --   HGB 12.7* 9.1* 8.3* 8.0* 7.3* 7.2*  HCT 38.9* 26.7*  --   --   --  20.6*  MCV 94.4 90.8  --   --   --  90.7  PLT 282 297  --   --   --  206   Cardiac Enzymes:  Recent Labs Lab 02/22/16 1243  TROPONINI 0.03*   BNP: Invalid input(s): POCBNP CBG:  Recent Labs Lab 02/22/16 1944 02/22/16 2351 02/23/16 0337 02/23/16 0743 02/23/16 1117  GLUCAP 135* 148* 132* 123* 120*   D-Dimer No results for input(s): DDIMER in the last 72 hours. Hgb A1c  Recent Labs  02/22/16 1712  HGBA1C 5.7*   Lipid Profile No results for input(s): CHOL, HDL, LDLCALC, TRIG, CHOLHDL, LDLDIRECT in the last 72 hours. Thyroid function studies No results for input(s): TSH, T4TOTAL, T3FREE, THYROIDAB in the last 72 hours.  Invalid input(s): FREET3 Anemia work up No results for input(s): VITAMINB12, FOLATE, FERRITIN, TIBC, IRON, RETICCTPCT in the last 72 hours. Urinalysis  Component Value Date/Time   COLORURINE YELLOW 02/21/2016 1020   APPEARANCEUR CLEAR 02/21/2016 1020   LABSPEC 1.021 02/21/2016 1020   PHURINE 6.0 02/21/2016 1020   GLUCOSEU NEGATIVE 02/21/2016  1020   HGBUR NEGATIVE 02/21/2016 1020   BILIRUBINUR NEGATIVE 02/21/2016 1020   KETONESUR 15 (A) 02/21/2016 1020   PROTEINUR NEGATIVE 02/21/2016 1020   UROBILINOGEN 0.2 11/07/2013 2030   NITRITE NEGATIVE 02/21/2016 1020   LEUKOCYTESUR NEGATIVE 02/21/2016 1020   Sepsis Labs Invalid input(s): PROCALCITONIN,  WBC,  LACTICIDVEN   Time coordinating discharge: Over 30 minutes  SIGNED:   Renae FickleSHORT, Irmgard Rampersaud, MD  [facility] 02/23/2016, 12:50 PM Pager   If 7PM-7AM, please contact night-coverage [email or web address] Password [email or web address]

## 2016-02-26 LAB — TYPE AND SCREEN
BLOOD PRODUCT EXPIRATION DATE: 201801022359
ISSUE DATE / TIME: 201712221038
UNIT TYPE AND RH: 5100

## 2016-03-15 DIAGNOSIS — Z86010 Personal history of colonic polyps: Secondary | ICD-10-CM | POA: Diagnosis not present

## 2016-03-15 DIAGNOSIS — D500 Iron deficiency anemia secondary to blood loss (chronic): Secondary | ICD-10-CM | POA: Diagnosis not present

## 2016-03-26 DIAGNOSIS — D500 Iron deficiency anemia secondary to blood loss (chronic): Secondary | ICD-10-CM | POA: Diagnosis not present

## 2016-05-22 DIAGNOSIS — D500 Iron deficiency anemia secondary to blood loss (chronic): Secondary | ICD-10-CM | POA: Diagnosis not present

## 2016-06-20 DIAGNOSIS — E118 Type 2 diabetes mellitus with unspecified complications: Secondary | ICD-10-CM | POA: Diagnosis not present

## 2016-07-03 DIAGNOSIS — H17811 Minor opacity of cornea, right eye: Secondary | ICD-10-CM | POA: Diagnosis not present

## 2016-07-03 DIAGNOSIS — H524 Presbyopia: Secondary | ICD-10-CM | POA: Diagnosis not present

## 2016-07-03 DIAGNOSIS — H2513 Age-related nuclear cataract, bilateral: Secondary | ICD-10-CM | POA: Diagnosis not present

## 2016-07-03 DIAGNOSIS — H3509 Other intraretinal microvascular abnormalities: Secondary | ICD-10-CM | POA: Diagnosis not present

## 2016-07-03 DIAGNOSIS — H5203 Hypermetropia, bilateral: Secondary | ICD-10-CM | POA: Diagnosis not present

## 2016-07-03 DIAGNOSIS — H11151 Pinguecula, right eye: Secondary | ICD-10-CM | POA: Diagnosis not present

## 2016-07-03 DIAGNOSIS — H52223 Regular astigmatism, bilateral: Secondary | ICD-10-CM | POA: Diagnosis not present

## 2016-07-03 DIAGNOSIS — E119 Type 2 diabetes mellitus without complications: Secondary | ICD-10-CM | POA: Diagnosis not present

## 2016-07-03 DIAGNOSIS — H25012 Cortical age-related cataract, left eye: Secondary | ICD-10-CM | POA: Diagnosis not present

## 2016-10-02 DIAGNOSIS — R202 Paresthesia of skin: Secondary | ICD-10-CM | POA: Diagnosis not present

## 2016-10-02 DIAGNOSIS — I10 Essential (primary) hypertension: Secondary | ICD-10-CM | POA: Diagnosis not present

## 2016-10-02 DIAGNOSIS — E782 Mixed hyperlipidemia: Secondary | ICD-10-CM | POA: Diagnosis not present

## 2016-10-02 DIAGNOSIS — E1140 Type 2 diabetes mellitus with diabetic neuropathy, unspecified: Secondary | ICD-10-CM | POA: Diagnosis not present

## 2016-10-02 DIAGNOSIS — M50322 Other cervical disc degeneration at C5-C6 level: Secondary | ICD-10-CM | POA: Diagnosis not present

## 2016-10-09 DIAGNOSIS — M50223 Other cervical disc displacement at C6-C7 level: Secondary | ICD-10-CM | POA: Diagnosis not present

## 2016-10-16 DIAGNOSIS — R202 Paresthesia of skin: Secondary | ICD-10-CM | POA: Diagnosis not present

## 2016-10-16 DIAGNOSIS — M50322 Other cervical disc degeneration at C5-C6 level: Secondary | ICD-10-CM | POA: Diagnosis not present

## 2016-10-16 DIAGNOSIS — M50223 Other cervical disc displacement at C6-C7 level: Secondary | ICD-10-CM | POA: Diagnosis not present

## 2016-10-30 DIAGNOSIS — L810 Postinflammatory hyperpigmentation: Secondary | ICD-10-CM | POA: Diagnosis not present

## 2016-11-13 DIAGNOSIS — I10 Essential (primary) hypertension: Secondary | ICD-10-CM | POA: Diagnosis not present

## 2016-11-13 DIAGNOSIS — Z6835 Body mass index (BMI) 35.0-35.9, adult: Secondary | ICD-10-CM | POA: Diagnosis not present

## 2016-11-13 DIAGNOSIS — M4722 Other spondylosis with radiculopathy, cervical region: Secondary | ICD-10-CM | POA: Diagnosis not present

## 2016-11-20 DIAGNOSIS — M542 Cervicalgia: Secondary | ICD-10-CM | POA: Diagnosis not present

## 2016-11-20 DIAGNOSIS — R293 Abnormal posture: Secondary | ICD-10-CM | POA: Diagnosis not present

## 2016-11-20 DIAGNOSIS — M5412 Radiculopathy, cervical region: Secondary | ICD-10-CM | POA: Diagnosis not present

## 2016-11-20 DIAGNOSIS — M6281 Muscle weakness (generalized): Secondary | ICD-10-CM | POA: Diagnosis not present

## 2016-11-27 DIAGNOSIS — R293 Abnormal posture: Secondary | ICD-10-CM | POA: Diagnosis not present

## 2016-11-27 DIAGNOSIS — M6281 Muscle weakness (generalized): Secondary | ICD-10-CM | POA: Diagnosis not present

## 2016-11-27 DIAGNOSIS — M5412 Radiculopathy, cervical region: Secondary | ICD-10-CM | POA: Diagnosis not present

## 2016-11-27 DIAGNOSIS — M542 Cervicalgia: Secondary | ICD-10-CM | POA: Diagnosis not present

## 2016-12-04 DIAGNOSIS — M542 Cervicalgia: Secondary | ICD-10-CM | POA: Diagnosis not present

## 2016-12-04 DIAGNOSIS — M5412 Radiculopathy, cervical region: Secondary | ICD-10-CM | POA: Diagnosis not present

## 2016-12-04 DIAGNOSIS — R293 Abnormal posture: Secondary | ICD-10-CM | POA: Diagnosis not present

## 2016-12-04 DIAGNOSIS — M6281 Muscle weakness (generalized): Secondary | ICD-10-CM | POA: Diagnosis not present

## 2016-12-11 DIAGNOSIS — M6281 Muscle weakness (generalized): Secondary | ICD-10-CM | POA: Diagnosis not present

## 2016-12-11 DIAGNOSIS — R293 Abnormal posture: Secondary | ICD-10-CM | POA: Diagnosis not present

## 2016-12-11 DIAGNOSIS — M5412 Radiculopathy, cervical region: Secondary | ICD-10-CM | POA: Diagnosis not present

## 2016-12-11 DIAGNOSIS — M542 Cervicalgia: Secondary | ICD-10-CM | POA: Diagnosis not present

## 2016-12-18 DIAGNOSIS — R293 Abnormal posture: Secondary | ICD-10-CM | POA: Diagnosis not present

## 2016-12-18 DIAGNOSIS — M542 Cervicalgia: Secondary | ICD-10-CM | POA: Diagnosis not present

## 2016-12-18 DIAGNOSIS — M5412 Radiculopathy, cervical region: Secondary | ICD-10-CM | POA: Diagnosis not present

## 2016-12-18 DIAGNOSIS — M6281 Muscle weakness (generalized): Secondary | ICD-10-CM | POA: Diagnosis not present

## 2016-12-25 DIAGNOSIS — M6281 Muscle weakness (generalized): Secondary | ICD-10-CM | POA: Diagnosis not present

## 2016-12-25 DIAGNOSIS — M5412 Radiculopathy, cervical region: Secondary | ICD-10-CM | POA: Diagnosis not present

## 2016-12-25 DIAGNOSIS — R293 Abnormal posture: Secondary | ICD-10-CM | POA: Diagnosis not present

## 2016-12-25 DIAGNOSIS — M542 Cervicalgia: Secondary | ICD-10-CM | POA: Diagnosis not present

## 2017-01-01 ENCOUNTER — Other Ambulatory Visit: Payer: Self-pay | Admitting: Neurological Surgery

## 2017-01-01 DIAGNOSIS — M4722 Other spondylosis with radiculopathy, cervical region: Secondary | ICD-10-CM | POA: Diagnosis not present

## 2017-01-08 DIAGNOSIS — L810 Postinflammatory hyperpigmentation: Secondary | ICD-10-CM | POA: Diagnosis not present

## 2017-01-20 NOTE — Pre-Procedure Instructions (Addendum)
Alan MulderJohn Kasperski  01/20/2017      [facility] [place], [place] [address] [place] [address] Phone: [phone] Fax: [phone]    Your procedure is scheduled on Tuesday November 27.  [facility] Admitting at 5:30 A.M.  Call this number if you have problems the morning of surgery:  [phone]   Remember:  Do not eat food or drink liquids after midnight.  Take these medicines the morning of surgery with A SIP OF WATER: gabapentin (neurontin)  7 days prior to surgery STOP taking any Aspirin products (unless otherwise instructed by your surgeon), diclofenac (voltaren), Aleve, Naproxen, Ibuprofen, Motrin, Advil, Goody's, BC's, all herbal medications, fish oil, and all vitamins  DO NOT TAKE metformin (glucophage) the day of surgery.      How to Manage Your Diabetes Before and After Surgery  Why is it important to control my blood sugar before and after surgery? . Improving blood sugar levels before and after surgery helps healing and can limit problems. . A way of improving blood sugar control is eating a healthy diet by: o  Eating less sugar and carbohydrates o  Increasing activity/exercise o  Talking with your doctor about reaching your blood sugar goals . High blood sugars (greater than 180 mg/dL) can raise your risk of infections and slow your recovery, so you will need to focus on controlling your diabetes during the weeks before surgery. . Make sure that the doctor who takes care of your diabetes knows about your planned surgery including the date and location.  How do I manage my blood sugar before surgery? . Check your blood sugar at least 4 times a day, starting 2 days before surgery, to make sure that the level is not too high or low. o Check your blood sugar the morning of your surgery when you wake up and every 2 hours until you get to the Short Stay unit. . If your blood sugar is less than  70 mg/dL, you will need to treat for low blood sugar: o Do not take insulin. o Treat a low blood sugar (less than 70 mg/dL) with  cup of clear juice (cranberry or apple), 4 glucose tablets, OR glucose gel. Recheck blood sugar in 15 minutes after treatment (to make sure it is greater than 70 mg/dL). If your blood sugar is not greater than 70 mg/dL on recheck, call [phone] o  for further instructions. . Report your blood sugar to the short stay nurse when you get to Short Stay.  . If you are admitted to the hospital after surgery: o Your blood sugar will be checked by the staff and you will probably be given insulin after surgery (instead of oral diabetes medicines) to make sure you have good blood sugar levels. o The goal for blood sugar control after surgery is 80-180 mg/dL.              Do not wear jewelry, make-up or nail polish.  Do not wear lotions, powders, or perfumes, or deoderant.  Do not shave 48 hours prior to surgery.  Men may shave face and neck.  Do not bring valuables to the hospital.  [facility] is not responsible for any belongings or valuables.  Contacts, dentures or bridgework may not be worn into surgery.  Leave your suitcase in the car.  After surgery it may be brought to your room.  For patients admitted to the hospital,  discharge time will be determined by your treatment team.  Patients discharged the day of surgery will not be allowed to drive home.   Special instructions:    [facility]- Preparing For Surgery  Before surgery, you can play an important role. Because skin is not sterile, your skin needs to be as free of germs as possible. You can reduce the number of germs on your skin by washing with CHG (chlorahexidine gluconate) Soap before surgery.  CHG is an antiseptic cleaner which kills germs and bonds with the skin to continue killing germs even after washing.  Please do not use if you have an allergy to CHG or antibacterial soaps. If your  skin becomes reddened/irritated stop using the CHG.  Do not shave (including legs and underarms) for at least 48 hours prior to first CHG shower. It is OK to shave your face.  Please follow these instructions carefully.   1. Shower the NIGHT BEFORE SURGERY and the MORNING OF SURGERY with CHG.   2. If you chose to wash your hair, wash your hair first as usual with your normal shampoo.  3. After you shampoo, rinse your hair and body thoroughly to remove the shampoo.  4. Use CHG as you would any other liquid soap. You can apply CHG directly to the skin and wash gently with a scrungie or a clean washcloth.   5. Apply the CHG Soap to your body ONLY FROM THE NECK DOWN.  Do not use on open wounds or open sores. Avoid contact with your eyes, ears, mouth and genitals (private parts). Wash Face and genitals (private parts)  with your normal soap.  6. Wash thoroughly, paying special attention to the area where your surgery will be performed.  7. Thoroughly rinse your body with warm water from the neck down.  8. DO NOT shower/wash with your normal soap after using and rinsing off the CHG Soap.  9. Pat yourself dry with a CLEAN TOWEL.  10. Wear CLEAN PAJAMAS to bed the night before surgery, wear comfortable clothes the morning of surgery  11. Place CLEAN SHEETS on your bed the night of your first shower and DO NOT SLEEP WITH PETS.    Day of Surgery: Do not apply any deodorants/lotions. Please wear clean clothes to the hospital/surgery center.      Please read over the following fact sheets that you were given. Coughing and Deep Breathing, MRSA Information and Surgical Site Infection Prevention

## 2017-01-21 ENCOUNTER — Other Ambulatory Visit: Payer: Self-pay

## 2017-01-21 ENCOUNTER — Encounter (HOSPITAL_COMMUNITY): Payer: Self-pay

## 2017-01-21 ENCOUNTER — Encounter (HOSPITAL_COMMUNITY)
Admission: RE | Admit: 2017-01-21 | Discharge: 2017-01-21 | Disposition: A | Discharge status: Home / Self Care | Payer: Medicare Other | Source: Ambulatory Visit | Attending: Neurological Surgery | Admitting: Neurological Surgery

## 2017-01-21 DIAGNOSIS — E119 Type 2 diabetes mellitus without complications: Secondary | ICD-10-CM | POA: Insufficient documentation

## 2017-01-21 DIAGNOSIS — Z01818 Encounter for other preprocedural examination: Secondary | ICD-10-CM | POA: Diagnosis not present

## 2017-01-21 DIAGNOSIS — R9431 Abnormal electrocardiogram [ECG] [EKG]: Secondary | ICD-10-CM | POA: Diagnosis not present

## 2017-01-21 HISTORY — DX: Other complications of anesthesia, initial encounter: T88.59XA

## 2017-01-21 HISTORY — DX: Adverse effect of unspecified anesthetic, initial encounter: T41.45XA

## 2017-01-21 LAB — TYPE AND SCREEN
ABO/RH(D): O POS
Antibody Screen: NEGATIVE

## 2017-01-21 LAB — BASIC METABOLIC PANEL
Anion gap: 7 (ref 5–15)
BUN: 12 mg/dL (ref 6–20)
CALCIUM: 9.1 mg/dL (ref 8.9–10.3)
CHLORIDE: 107 mmol/L (ref 101–111)
CO2: 24 mmol/L (ref 22–32)
CREATININE: 1.29 mg/dL — AB (ref 0.61–1.24)
GFR calc Af Amer: >60 mL/min (ref >60)
GFR, EST NON AFRICAN AMERICAN: 59 mL/min — AB (ref >60)
Glucose, Bld: 114 mg/dL — ABNORMAL HIGH (ref 65–99)
Potassium: 3.7 mmol/L (ref 3.5–5.1)
SODIUM: 138 mmol/L (ref 135–145)

## 2017-01-21 LAB — ABO/RH: ABO/RH(D): O POS

## 2017-01-21 LAB — CBC
HCT: 40.2 % (ref 39.0–52.0)
Hemoglobin: 13.4 g/dL (ref 13.0–17.0)
MCH: 31.1 pg (ref 26.0–34.0)
MCHC: 33.3 g/dL (ref 30.0–36.0)
MCV: 93.3 fL (ref 78.0–100.0)
PLATELETS: 241 10*3/uL (ref 150–400)
RBC: 4.31 MIL/uL (ref 4.22–5.81)
RDW: 13.6 % (ref 11.5–15.5)
WBC: 7.9 10*3/uL (ref 4.0–10.5)

## 2017-01-21 LAB — HEMOGLOBIN A1C
HEMOGLOBIN A1C: 6.2 % — AB (ref 4.8–5.6)
MEAN PLASMA GLUCOSE: 131.24 mg/dL

## 2017-01-21 LAB — GLUCOSE, CAPILLARY: GLUCOSE-CAPILLARY: 109 mg/dL — AB (ref 65–99)

## 2017-01-21 LAB — SURGICAL PCR SCREEN
MRSA, PCR: NEGATIVE
Staphylococcus aureus: NEGATIVE

## 2017-01-21 NOTE — Progress Notes (Signed)
Patient requesting that his belongings be placed in recovery the day of surgery and immediately given back to him and no one else as soon as he wakes up.

## 2017-01-21 NOTE — Progress Notes (Addendum)
PCP - was seeing Deneen HartsElizabeth Todd but will see new PCP tomorrow, unsure of the name of PCP but "they were assigned to me" at the same practice Cardiologist - denies  EKG - 01/21/2017  Pt reports possibly having an ultrasound of his heart done more than 10 years ago that was normal.   Pt does not have way to check CBG at home. A1c drawn today.   Patient denies shortness of breath, fever, cough and chest pain at PAT appointment  Patient verbalized understanding of instructions that were given to them at the PAT appointment. Patient was also instructed that they will need to review over the PAT instructions again at home before surgery.

## 2017-01-22 DIAGNOSIS — M5010 Cervical disc disorder with radiculopathy, unspecified cervical region: Secondary | ICD-10-CM | POA: Diagnosis not present

## 2017-01-22 DIAGNOSIS — G8929 Other chronic pain: Secondary | ICD-10-CM | POA: Diagnosis not present

## 2017-01-22 DIAGNOSIS — Z23 Encounter for immunization: Secondary | ICD-10-CM | POA: Diagnosis not present

## 2017-01-22 DIAGNOSIS — M545 Low back pain: Secondary | ICD-10-CM | POA: Diagnosis not present

## 2017-01-22 DIAGNOSIS — M542 Cervicalgia: Secondary | ICD-10-CM | POA: Diagnosis not present

## 2017-01-22 NOTE — Progress Notes (Signed)
Anesthesia Chart Review: Patient is a 61 year old male scheduled for C4-7 ACDF on 01/28/17 by Dr. Sharlet SalinaBenjamin Ditty.  History includes former smoker (quit '98), HTN, DM2, acute GI bleed 02/22/16, back surgery, joint replacement (not specified), elbow fracture surgery. BMI is consistent with obesity.  - [facility]CP is [facility] Jen[facility]uel [facility] (see Care Everywhere), first established 01/22/17. He was previously seeing Deneen HartsElizabeth Todd, FNP at that same office (last note with her 10/02/16) but she relocated. Dr. Tresa EndoKelly is aware of surgery plans. - GI is Dr. Molly Maduroobert Buccini. Patient was hospitalized 02/2016 with symptomatic anemia. He had EGD which showed nonbleeding erosive gastropathy consistent with NSAID exposure but no obvious ulcers or active bleeding. He had a widely patent Schatzki ring that was incidentally noted. He was advised to start Protonix 40 mg once daily and avoid NSAIDs.    Meds include gabapentin, lisinopril, metformin, simvastatin.   BP (!) 150/82   Pulse 75   Temp 36.7 C   Resp 20   Ht 5\' 11"  (1.803 m)   Wt 250 lb 3.2 oz (113.5 kg)   SpO2 95%   BMI 34.90 kg/m    EKG 01/21/17: NSR, cannot rule out inferior infarct (age undetermined). He has a Q wave in lead III, but does not appear significant in aVF. No Q wave in II. He had a Q wave in III on 05/05/13 tracing found in [facility], but did not appear significant.   He may have had an echo in the past, but it so was > 10 years ago.  1V CXR 02/22/16: IMPRESSION: No active disease.  MRI C-spine 10/16/16 (Care Everywhere): Impression: 1. At C3-4 there is a mild broad-based disc bulge. Bilateral uncovertebral degenerative changes. Severe bilateral foraminal stenosis. 2. At C4-5 there is a broad-based disc bulge with a left paracentral/ foraminal disc protrusion. Severe bilateral foraminal stenosis. 3. At C5-6 there is a broad-based disc bulge. Left paracentral shallow disc protrusion. Moderate right and severe left foraminal stenosis. 4.  At C6-7 there is a broad central disc protrusion. Bilateral uncovertebral degenerative changes. Moderate right and severe left foraminal stenosis.  Preoperative labs noted. Cr 1.29. CBC WNL. Glucose 114. A1c 6.2.   Overall, I feel Q wave in III is isolated. There is no reported CAD/MI history. DM is well controlled. He denied chest pain, SOB, cough, fever at PAT. He was evaluated by his PCP today. If no acute changes then I would anticipate that he can proceed as planned.  Velna Ochsllison Avacyn Kloosterman, PA-C [facility] Phone [phone] 01/22/2017 3:23 PM

## 2017-01-27 NOTE — Anesthesia Preprocedure Evaluation (Addendum)
Anesthesia Evaluation  Patient identified by MRN, date of birth, ID band Patient awake    Reviewed: Allergy & Precautions, H&P , NPO status , Patient's Chart, lab work & pertinent test results  Airway Mallampati: II  TM Distance: >3 FB Neck ROM: Full    Dental no notable dental hx. (+) Partial Upper, Dental Advisory Given   Pulmonary neg pulmonary ROS, former smoker,    Pulmonary exam normal breath sounds clear to auscultation       Cardiovascular Exercise Tolerance: Good hypertension, Pt. on medications  Rhythm:Regular Rate:Normal     Neuro/Psych negative neurological ROS  negative psych ROS   GI/Hepatic negative GI ROS, Neg liver ROS,   Endo/Other  diabetes, Type 2, Oral Hypoglycemic Agents  Renal/GU Renal InsufficiencyRenal disease  negative genitourinary   Musculoskeletal   Abdominal   Peds  Hematology negative hematology ROS (+)   Anesthesia Other Findings   Reproductive/Obstetrics negative OB ROS                            Anesthesia Physical Anesthesia Plan  ASA: II  Anesthesia Plan: General   Post-op Pain Management:    Induction: Intravenous  PONV Risk Score and Plan: 3 and Ondansetron, Dexamethasone and Midazolam  Airway Management Planned: Oral ETT  Additional Equipment:   Intra-op Plan:   Post-operative Plan: Extubation in OR  Informed Consent: I have reviewed the patients History and Physical, chart, labs and discussed the procedure including the risks, benefits and alternatives for the proposed anesthesia with the patient or authorized representative who has indicated his/her understanding and acceptance.   Dental advisory given  Plan Discussed with: CRNA  Anesthesia Plan Comments:         Anesthesia Quick Evaluation

## 2017-01-28 ENCOUNTER — Inpatient Hospital Stay (HOSPITAL_COMMUNITY)
Admission: RE | Admit: 2017-01-28 | Discharge: 2017-01-29 | DRG: 473 | Disposition: A | Discharge status: Home / Self Care | Payer: Medicare Other | Source: Ambulatory Visit | Attending: Neurological Surgery | Admitting: Neurological Surgery

## 2017-01-28 ENCOUNTER — Inpatient Hospital Stay (HOSPITAL_COMMUNITY): Payer: Medicare Other | Admitting: Vascular Surgery

## 2017-01-28 ENCOUNTER — Inpatient Hospital Stay (HOSPITAL_COMMUNITY): Payer: Medicare Other | Admitting: Anesthesiology

## 2017-01-28 ENCOUNTER — Inpatient Hospital Stay (HOSPITAL_COMMUNITY)
Admission: RE | Disposition: A | Discharge status: Home / Self Care | Payer: Self-pay | Source: Ambulatory Visit | Attending: Neurological Surgery

## 2017-01-28 ENCOUNTER — Inpatient Hospital Stay (HOSPITAL_COMMUNITY): Payer: Medicare Other

## 2017-01-28 ENCOUNTER — Encounter (HOSPITAL_COMMUNITY): Payer: Self-pay | Admitting: Critical Care Medicine

## 2017-01-28 DIAGNOSIS — D62 Acute posthemorrhagic anemia: Secondary | ICD-10-CM | POA: Diagnosis not present

## 2017-01-28 DIAGNOSIS — M4722 Other spondylosis with radiculopathy, cervical region: Secondary | ICD-10-CM | POA: Diagnosis present

## 2017-01-28 DIAGNOSIS — Z419 Encounter for procedure for purposes other than remedying health state, unspecified: Secondary | ICD-10-CM

## 2017-01-28 DIAGNOSIS — M4322 Fusion of spine, cervical region: Secondary | ICD-10-CM | POA: Diagnosis not present

## 2017-01-28 DIAGNOSIS — E119 Type 2 diabetes mellitus without complications: Secondary | ICD-10-CM | POA: Diagnosis present

## 2017-01-28 DIAGNOSIS — Z87891 Personal history of nicotine dependence: Secondary | ICD-10-CM

## 2017-01-28 DIAGNOSIS — I10 Essential (primary) hypertension: Secondary | ICD-10-CM | POA: Diagnosis present

## 2017-01-28 DIAGNOSIS — Z7984 Long term (current) use of oral hypoglycemic drugs: Secondary | ICD-10-CM | POA: Diagnosis not present

## 2017-01-28 HISTORY — PX: ANTERIOR CERVICAL DECOMP/DISCECTOMY FUSION: SHX1161

## 2017-01-28 LAB — GLUCOSE, CAPILLARY
GLUCOSE-CAPILLARY: 136 mg/dL — AB (ref 65–99)
GLUCOSE-CAPILLARY: 128 mg/dL — AB (ref 65–99)
Glucose-Capillary: 122 mg/dL — ABNORMAL HIGH (ref 65–99)
Glucose-Capillary: 153 mg/dL — ABNORMAL HIGH (ref 65–99)

## 2017-01-28 SURGERY — ANTERIOR CERVICAL DECOMPRESSION/DISCECTOMY FUSION 3 LEVELS
Anesthesia: General

## 2017-01-28 MED ORDER — METFORMIN HCL 500 MG PO TABS
500.0000 mg | ORAL_TABLET | Freq: Two times a day (BID) | ORAL | Status: DC
  Administered 2017-01-28 – 2017-01-29 (×2): 500 mg via ORAL
  Filled 2017-01-28 (×2): qty 1

## 2017-01-28 MED ORDER — OXYCODONE HCL 5 MG PO TABS
ORAL_TABLET | ORAL | Status: AC
  Filled 2017-01-28: qty 2

## 2017-01-28 MED ORDER — BISACODYL 10 MG RE SUPP: 10.0000 mg | Freq: Every day | RECTAL | Status: DC | PRN

## 2017-01-28 MED ORDER — FLEET ENEMA 7-19 GM/118ML RE ENEM: 1.0000 | ENEMA | Freq: Once | RECTAL | Status: DC | PRN

## 2017-01-28 MED ORDER — FENTANYL CITRATE (PF) 250 MCG/5ML IJ SOLN
INTRAMUSCULAR | Status: AC
  Filled 2017-01-28: qty 5

## 2017-01-28 MED ORDER — THROMBIN (RECOMBINANT) 5000 UNITS EX SOLR
CUTANEOUS | Status: AC
  Filled 2017-01-28: qty 5000

## 2017-01-28 MED ORDER — CEFAZOLIN SODIUM-DEXTROSE 2-4 GM/100ML-% IV SOLN
INTRAVENOUS | Status: AC
  Filled 2017-01-28: qty 100

## 2017-01-28 MED ORDER — ACETAMINOPHEN 325 MG PO TABS: 650.0000 mg | ORAL_TABLET | ORAL | Status: DC | PRN

## 2017-01-28 MED ORDER — SODIUM CHLORIDE 0.9% FLUSH
3.0000 mL | Freq: Two times a day (BID) | INTRAVENOUS | Status: DC
Start: 2017-01-28 — End: 2017-01-29

## 2017-01-28 MED ORDER — SUGAMMADEX SODIUM 200 MG/2ML IV SOLN
INTRAVENOUS | Status: DC | PRN
  Administered 2017-01-28: 230 mg via INTRAVENOUS

## 2017-01-28 MED ORDER — BUPIVACAINE-EPINEPHRINE (PF) 0.5% -1:200000 IJ SOLN
INTRAMUSCULAR | Status: DC | PRN
  Administered 2017-01-28: 10 mL

## 2017-01-28 MED ORDER — CHLORHEXIDINE GLUCONATE CLOTH 2 % EX PADS: 6.0000 | MEDICATED_PAD | Freq: Once | CUTANEOUS | Status: DC

## 2017-01-28 MED ORDER — PHENOL 1.4 % MT LIQD
1.0000 | OROMUCOSAL | Status: DC | PRN
  Administered 2017-01-28: 1 via OROMUCOSAL
  Filled 2017-01-28: qty 177

## 2017-01-28 MED ORDER — LACTATED RINGERS IV SOLN
INTRAVENOUS | Status: DC | PRN
  Administered 2017-01-28 (×2): via INTRAVENOUS

## 2017-01-28 MED ORDER — CEFAZOLIN SODIUM-DEXTROSE 2-4 GM/100ML-% IV SOLN
2.0000 g | INTRAVENOUS | Status: AC
  Administered 2017-01-28: 2 g via INTRAVENOUS

## 2017-01-28 MED ORDER — DEXAMETHASONE 4 MG PO TABS
4.0000 mg | ORAL_TABLET | Freq: Four times a day (QID) | ORAL | Status: DC
  Administered 2017-01-28 (×2): 4 mg via ORAL
  Filled 2017-01-28 (×2): qty 1

## 2017-01-28 MED ORDER — METHOCARBAMOL 750 MG PO TABS
750.0000 mg | ORAL_TABLET | Freq: Four times a day (QID) | ORAL | Status: DC
  Administered 2017-01-28 – 2017-01-29 (×3): 750 mg via ORAL
  Filled 2017-01-28 (×3): qty 1

## 2017-01-28 MED ORDER — ACETAMINOPHEN 500 MG PO TABS
1000.0000 mg | ORAL_TABLET | Freq: Four times a day (QID) | ORAL | Status: DC
  Administered 2017-01-28: 1000 mg via ORAL
  Filled 2017-01-28: qty 2

## 2017-01-28 MED ORDER — SODIUM CHLORIDE 0.9 % IV SOLN
INTRAVENOUS | Status: DC
Start: 2017-01-28 — End: 2017-01-29

## 2017-01-28 MED ORDER — DEXAMETHASONE SODIUM PHOSPHATE 10 MG/ML IJ SOLN
INTRAMUSCULAR | Status: DC | PRN
  Administered 2017-01-28: 4 mg via INTRAVENOUS

## 2017-01-28 MED ORDER — CELECOXIB 200 MG PO CAPS
200.0000 mg | ORAL_CAPSULE | Freq: Two times a day (BID) | ORAL | Status: DC
  Administered 2017-01-28 – 2017-01-29 (×3): 200 mg via ORAL
  Filled 2017-01-28 (×3): qty 1

## 2017-01-28 MED ORDER — SIMVASTATIN 20 MG PO TABS
40.0000 mg | ORAL_TABLET | Freq: Every day | ORAL | Status: DC
Start: 2017-01-28 — End: 2017-01-29
  Administered 2017-01-28: 40 mg via ORAL
  Filled 2017-01-28: qty 2

## 2017-01-28 MED ORDER — LISINOPRIL 20 MG PO TABS
20.0000 mg | ORAL_TABLET | Freq: Every day | ORAL | Status: DC
  Administered 2017-01-28 – 2017-01-29 (×2): 20 mg via ORAL
  Filled 2017-01-28 (×2): qty 1

## 2017-01-28 MED ORDER — 0.9 % SODIUM CHLORIDE (POUR BTL) OPTIME
TOPICAL | Status: DC | PRN
  Administered 2017-01-28: 1000 mL

## 2017-01-28 MED ORDER — HYDROMORPHONE HCL 1 MG/ML IJ SOLN
INTRAMUSCULAR | Status: AC
  Filled 2017-01-28: qty 1

## 2017-01-28 MED ORDER — ONDANSETRON HCL 4 MG PO TABS: 4.0000 mg | ORAL_TABLET | Freq: Four times a day (QID) | ORAL | Status: DC | PRN

## 2017-01-28 MED ORDER — FENTANYL CITRATE (PF) 250 MCG/5ML IJ SOLN
INTRAMUSCULAR | Status: DC | PRN
  Administered 2017-01-28 (×6): 50 ug via INTRAVENOUS

## 2017-01-28 MED ORDER — MIDAZOLAM HCL 5 MG/5ML IJ SOLN
INTRAMUSCULAR | Status: DC | PRN
  Administered 2017-01-28: 2 mg via INTRAVENOUS

## 2017-01-28 MED ORDER — ACETAMINOPHEN 500 MG PO TABS
1000.0000 mg | ORAL_TABLET | Freq: Four times a day (QID) | ORAL | Status: DC
  Administered 2017-01-28 – 2017-01-29 (×2): 1000 mg via ORAL
  Filled 2017-01-28 (×2): qty 2

## 2017-01-28 MED ORDER — EPHEDRINE 5 MG/ML INJ
INTRAVENOUS | Status: AC
  Filled 2017-01-28: qty 10

## 2017-01-28 MED ORDER — ROCURONIUM BROMIDE 10 MG/ML (PF) SYRINGE
PREFILLED_SYRINGE | INTRAVENOUS | Status: DC | PRN
  Administered 2017-01-28: 20 mg via INTRAVENOUS
  Administered 2017-01-28: 50 mg via INTRAVENOUS

## 2017-01-28 MED ORDER — PHENYLEPHRINE 40 MCG/ML (10ML) SYRINGE FOR IV PUSH (FOR BLOOD PRESSURE SUPPORT)
PREFILLED_SYRINGE | INTRAVENOUS | Status: AC
  Filled 2017-01-28: qty 20

## 2017-01-28 MED ORDER — HYDROMORPHONE HCL 1 MG/ML IJ SOLN
0.2500 mg | INTRAMUSCULAR | Status: DC | PRN
  Administered 2017-01-28 (×2): 0.5 mg via INTRAVENOUS

## 2017-01-28 MED ORDER — DEXAMETHASONE SODIUM PHOSPHATE 4 MG/ML IJ SOLN: 2.0000 mg | Freq: Four times a day (QID) | INTRAMUSCULAR | Status: AC

## 2017-01-28 MED ORDER — OXYCODONE HCL ER 15 MG PO T12A
15.0000 mg | EXTENDED_RELEASE_TABLET | Freq: Two times a day (BID) | ORAL | Status: DC
  Administered 2017-01-28 – 2017-01-29 (×2): 15 mg via ORAL
  Filled 2017-01-28 (×2): qty 1

## 2017-01-28 MED ORDER — SENNA 8.6 MG PO TABS
1.0000 | ORAL_TABLET | Freq: Two times a day (BID) | ORAL | Status: DC
  Administered 2017-01-28 – 2017-01-29 (×3): 8.6 mg via ORAL
  Filled 2017-01-28 (×3): qty 1

## 2017-01-28 MED ORDER — OXYCODONE HCL 5 MG PO TABS: 5.0000 mg | ORAL_TABLET | ORAL | Status: DC | PRN

## 2017-01-28 MED ORDER — PROPOFOL 10 MG/ML IV BOLUS
INTRAVENOUS | Status: AC
  Filled 2017-01-28: qty 20

## 2017-01-28 MED ORDER — LIDOCAINE 2% (20 MG/ML) 5 ML SYRINGE
INTRAMUSCULAR | Status: AC
  Filled 2017-01-28: qty 20

## 2017-01-28 MED ORDER — DEXAMETHASONE SODIUM PHOSPHATE 10 MG/ML IJ SOLN
INTRAMUSCULAR | Status: AC
  Filled 2017-01-28: qty 2

## 2017-01-28 MED ORDER — DEXAMETHASONE SODIUM PHOSPHATE 4 MG/ML IJ SOLN: 2.0000 mg | Freq: Four times a day (QID) | INTRAMUSCULAR | Status: DC

## 2017-01-28 MED ORDER — THROMBIN (RECOMBINANT) 5000 UNITS EX SOLR
CUTANEOUS | Status: DC | PRN
  Administered 2017-01-28 (×2): via TOPICAL

## 2017-01-28 MED ORDER — PANTOPRAZOLE SODIUM 40 MG IV SOLR: 40.0000 mg | Freq: Every day | INTRAVENOUS | Status: DC

## 2017-01-28 MED ORDER — SODIUM CHLORIDE 0.9% FLUSH: 3.0000 mL | INTRAVENOUS | Status: DC | PRN

## 2017-01-28 MED ORDER — BUPIVACAINE-EPINEPHRINE (PF) 0.5% -1:200000 IJ SOLN
INTRAMUSCULAR | Status: AC
  Filled 2017-01-28: qty 30

## 2017-01-28 MED ORDER — ZOLPIDEM TARTRATE 5 MG PO TABS: 5.0000 mg | ORAL_TABLET | Freq: Every evening | ORAL | Status: DC | PRN

## 2017-01-28 MED ORDER — DEXAMETHASONE 4 MG PO TABS
4.0000 mg | ORAL_TABLET | Freq: Four times a day (QID) | ORAL | Status: AC
  Administered 2017-01-28: 4 mg via ORAL
  Filled 2017-01-28: qty 1

## 2017-01-28 MED ORDER — OXYCODONE HCL 5 MG PO TABS
10.0000 mg | ORAL_TABLET | ORAL | Status: DC | PRN
  Administered 2017-01-28 – 2017-01-29 (×5): 10 mg via ORAL
  Filled 2017-01-28 (×4): qty 2

## 2017-01-28 MED ORDER — LIDOCAINE 2% (20 MG/ML) 5 ML SYRINGE
INTRAMUSCULAR | Status: DC | PRN
  Administered 2017-01-28: 60 mg via INTRAVENOUS

## 2017-01-28 MED ORDER — PANTOPRAZOLE SODIUM 40 MG PO TBEC
40.0000 mg | DELAYED_RELEASE_TABLET | Freq: Every day | ORAL | Status: DC
  Administered 2017-01-28: 40 mg via ORAL
  Filled 2017-01-28: qty 1

## 2017-01-28 MED ORDER — DIAZEPAM 5 MG PO TABS: 5.0000 mg | ORAL_TABLET | Freq: Four times a day (QID) | ORAL | Status: DC | PRN

## 2017-01-28 MED ORDER — ONDANSETRON HCL 4 MG/2ML IJ SOLN
INTRAMUSCULAR | Status: DC | PRN
  Administered 2017-01-28: 4 mg via INTRAVENOUS

## 2017-01-28 MED ORDER — ONDANSETRON HCL 4 MG/2ML IJ SOLN
INTRAMUSCULAR | Status: AC
  Filled 2017-01-28: qty 2

## 2017-01-28 MED ORDER — PHENYLEPHRINE 40 MCG/ML (10ML) SYRINGE FOR IV PUSH (FOR BLOOD PRESSURE SUPPORT)
PREFILLED_SYRINGE | INTRAVENOUS | Status: DC | PRN
  Administered 2017-01-28: 80 ug via INTRAVENOUS
  Administered 2017-01-28 (×3): 40 ug via INTRAVENOUS
  Administered 2017-01-28: 80 ug via INTRAVENOUS

## 2017-01-28 MED ORDER — DOCUSATE SODIUM 100 MG PO CAPS
100.0000 mg | ORAL_CAPSULE | Freq: Two times a day (BID) | ORAL | Status: DC
  Administered 2017-01-28 – 2017-01-29 (×3): 100 mg via ORAL
  Filled 2017-01-28 (×3): qty 1

## 2017-01-28 MED ORDER — INSULIN ASPART 100 UNIT/ML ~~LOC~~ SOLN: 0.0000 [IU] | Freq: Three times a day (TID) | SUBCUTANEOUS | Status: DC

## 2017-01-28 MED ORDER — LIDOCAINE-EPINEPHRINE 2 %-1:100000 IJ SOLN
INTRAMUSCULAR | Status: DC | PRN
  Administered 2017-01-28: 10 mL via INTRADERMAL

## 2017-01-28 MED ORDER — MENTHOL 3 MG MT LOZG
1.0000 | LOZENGE | OROMUCOSAL | Status: DC | PRN
  Filled 2017-01-28: qty 9

## 2017-01-28 MED ORDER — GABAPENTIN 400 MG PO CAPS
400.0000 mg | ORAL_CAPSULE | Freq: Three times a day (TID) | ORAL | Status: DC
  Administered 2017-01-28 – 2017-01-29 (×3): 400 mg via ORAL
  Filled 2017-01-28 (×3): qty 1

## 2017-01-28 MED ORDER — SODIUM CHLORIDE 0.9 % IV SOLN: 250.0000 mL | INTRAVENOUS | Status: DC

## 2017-01-28 MED ORDER — CEFAZOLIN SODIUM-DEXTROSE 1-4 GM/50ML-% IV SOLN
1.0000 g | Freq: Three times a day (TID) | INTRAVENOUS | Status: AC
  Administered 2017-01-28 (×2): 1 g via INTRAVENOUS
  Filled 2017-01-28 (×2): qty 50

## 2017-01-28 MED ORDER — MIDAZOLAM HCL 2 MG/2ML IJ SOLN
INTRAMUSCULAR | Status: AC
  Filled 2017-01-28: qty 2

## 2017-01-28 MED ORDER — PROPOFOL 10 MG/ML IV BOLUS
INTRAVENOUS | Status: DC | PRN
  Administered 2017-01-28: 150 mg via INTRAVENOUS

## 2017-01-28 MED ORDER — SODIUM CHLORIDE 0.9 % IR SOLN
Status: DC | PRN
  Administered 2017-01-28: 08:00:00

## 2017-01-28 MED ORDER — LIDOCAINE-EPINEPHRINE 2 %-1:100000 IJ SOLN
INTRAMUSCULAR | Status: AC
  Filled 2017-01-28: qty 1

## 2017-01-28 MED ORDER — ROCURONIUM BROMIDE 10 MG/ML (PF) SYRINGE
PREFILLED_SYRINGE | INTRAVENOUS | Status: AC
  Filled 2017-01-28: qty 20

## 2017-01-28 MED ORDER — ONDANSETRON HCL 4 MG/2ML IJ SOLN
4.0000 mg | Freq: Four times a day (QID) | INTRAMUSCULAR | Status: DC | PRN
Start: 2017-01-28 — End: 2017-01-29

## 2017-01-28 MED ORDER — ACETAMINOPHEN 650 MG RE SUPP: 650.0000 mg | RECTAL | Status: DC | PRN

## 2017-01-28 MED ORDER — PHENYLEPHRINE HCL 10 MG/ML IJ SOLN
INTRAMUSCULAR | Status: DC | PRN
  Administered 2017-01-28: 25 ug/min via INTRAVENOUS

## 2017-01-28 SURGICAL SUPPLY — 73 items
BAG DECANTER FOR FLEXI CONT (MISCELLANEOUS) ×3 IMPLANT
BIT DRILL 14MM (INSTRUMENTS) ×1 IMPLANT
BUR MATCHSTICK NEURO 3.0 LAGG (BURR) ×3 IMPLANT
CAGE C-PTI 5X16X18 7D LG (Cage) ×9 IMPLANT
CANISTER SUCT 3000ML PPV (MISCELLANEOUS) ×3 IMPLANT
CARTRIDGE OIL MAESTRO DRILL (MISCELLANEOUS) ×1 IMPLANT
CHLORAPREP W/TINT 26ML (MISCELLANEOUS) ×3 IMPLANT
DECANTER SPIKE VIAL GLASS SM (MISCELLANEOUS) ×3 IMPLANT
DERMABOND ADVANCED (GAUZE/BANDAGES/DRESSINGS) ×2
DERMABOND ADVANCED .7 DNX12 (GAUZE/BANDAGES/DRESSINGS) ×1 IMPLANT
DIFFUSER DRILL AIR PNEUMATIC (MISCELLANEOUS) ×3 IMPLANT
DRAIN SNY WOU 7FLT (WOUND CARE) ×3 IMPLANT
DRAPE C-ARM 42X72 X-RAY (DRAPES) ×6 IMPLANT
DRAPE HALF SHEET 40X57 (DRAPES) IMPLANT
DRAPE LAPAROTOMY 100X72 PEDS (DRAPES) ×3 IMPLANT
DRAPE MICROSCOPE LEICA (MISCELLANEOUS) ×3 IMPLANT
DRAPE POUCH INSTRU U-SHP 10X18 (DRAPES) ×3 IMPLANT
DRAPE SHEET LG 3/4 BI-LAMINATE (DRAPES) ×3 IMPLANT
DRILL 14MM (INSTRUMENTS) ×3
DRSG OPSITE POSTOP 4X6 (GAUZE/BANDAGES/DRESSINGS) ×3 IMPLANT
ELECT COATED BLADE 2.86 ST (ELECTRODE) ×3 IMPLANT
ELECT REM PT RETURN 9FT ADLT (ELECTROSURGICAL) ×3
ELECTRODE REM PT RTRN 9FT ADLT (ELECTROSURGICAL) ×1 IMPLANT
EVACUATOR 1/8 PVC DRAIN (DRAIN) IMPLANT
EVACUATOR SILICONE 100CC (DRAIN) ×3 IMPLANT
GAUZE SPONGE 4X4 12PLY STRL (GAUZE/BANDAGES/DRESSINGS) IMPLANT
GAUZE SPONGE 4X4 16PLY XRAY LF (GAUZE/BANDAGES/DRESSINGS) IMPLANT
GLOVE BIO SURGEON STRL SZ7 (GLOVE) IMPLANT
GLOVE BIOGEL PI IND STRL 7.0 (GLOVE) IMPLANT
GLOVE BIOGEL PI IND STRL 7.5 (GLOVE) ×1 IMPLANT
GLOVE BIOGEL PI INDICATOR 7.0 (GLOVE)
GLOVE BIOGEL PI INDICATOR 7.5 (GLOVE) ×2
GLOVE EXAM NITRILE LRG STRL (GLOVE) IMPLANT
GLOVE SS BIOGEL STRL SZ 7.5 (GLOVE) ×2 IMPLANT
GLOVE SUPERSENSE BIOGEL SZ 7.5 (GLOVE) ×4
GOWN STRL REUS W/ TWL LRG LVL3 (GOWN DISPOSABLE) ×1 IMPLANT
GOWN STRL REUS W/ TWL XL LVL3 (GOWN DISPOSABLE) ×1 IMPLANT
GOWN STRL REUS W/TWL LRG LVL3 (GOWN DISPOSABLE) ×2
GOWN STRL REUS W/TWL XL LVL3 (GOWN DISPOSABLE) ×2
HEMOSTAT POWDER KIT SURGIFOAM (HEMOSTASIS) ×6 IMPLANT
KIT BASIN OR (CUSTOM PROCEDURE TRAY) ×3 IMPLANT
KIT ROOM TURNOVER OR (KITS) ×3 IMPLANT
NEEDLE HYPO 21X1.5 SAFETY (NEEDLE) ×3 IMPLANT
NEEDLE SPNL 18GX3.5 QUINCKE PK (NEEDLE) ×3 IMPLANT
NS IRRIG 1000ML POUR BTL (IV SOLUTION) ×3 IMPLANT
OIL CARTRIDGE MAESTRO DRILL (MISCELLANEOUS) ×3
PACK LAMINECTOMY NEURO (CUSTOM PROCEDURE TRAY) ×3 IMPLANT
PAD ARMBOARD 7.5X6 YLW CONV (MISCELLANEOUS) ×3 IMPLANT
PATTIES SURGICAL .5X1.5 (GAUZE/BANDAGES/DRESSINGS) ×3 IMPLANT
PIN DISTRACTION 14MM (PIN) ×6 IMPLANT
PIN FIXATION (PIN) ×3 IMPLANT
PLATE 51MM (Plate) ×2 IMPLANT
PLATE 51X3 LVL NS SPNE CVD (Plate) ×1 IMPLANT
PUTTY DBM 5CC ×3 IMPLANT
RUBBERBAND STERILE (MISCELLANEOUS) ×6 IMPLANT
SCREW 16MM (Screw) ×18 IMPLANT
SCREW FA ST 4.0 16 (Screw) ×6 IMPLANT
SPONGE INTESTINAL PEANUT (DISPOSABLE) ×3 IMPLANT
SPONGE SURGIFOAM ABS GEL SZ50 (HEMOSTASIS) IMPLANT
STAPLER VISISTAT 35W (STAPLE) ×3 IMPLANT
STOCKINETTE 6  STRL (DRAPES) ×2
STOCKINETTE 6 STRL (DRAPES) ×1 IMPLANT
SUT STRATAFIX MNCRL+ 3-0 PS-2 (SUTURE) ×2
SUT STRATAFIX MONOCRYL 3-0 (SUTURE) ×4
SUT VIC AB 3-0 SH 8-18 (SUTURE) ×3 IMPLANT
SUTURE STRATFX MNCRL+ 3-0 PS-2 (SUTURE) ×2 IMPLANT
SYR 30ML LL (SYRINGE) IMPLANT
TOWEL GREEN STERILE (TOWEL DISPOSABLE) ×3 IMPLANT
TOWEL GREEN STERILE FF (TOWEL DISPOSABLE) ×6 IMPLANT
TRAY FOLEY CATH SILVER 16FR (SET/KITS/TRAYS/PACK) ×3 IMPLANT
TUBE CONNECTING 12'X1/4 (SUCTIONS) ×1
TUBE CONNECTING 12X1/4 (SUCTIONS) ×2 IMPLANT
WATER STERILE IRR 1000ML POUR (IV SOLUTION) ×3 IMPLANT

## 2017-01-28 NOTE — Op Note (Signed)
01/28/2017  10:36 AM  PATIENT:  Andre MulderJohn Rodriguez  61 y.o. male  PRE-OPERATIVE DIAGNOSIS:  Cervical spondylosis with radiculopathy  POST-OPERATIVE DIAGNOSIS:  Same  PROCEDURE:  C4-7 anterior discectomy with fusion and plate fixation; use of lordotic titanium cages  SURGEON:  Hulan SaasBenjamin J. Lucresha Dismuke, MD  ASSISTANTS: Cindra PresumeVincent Costella, PA-C  ANESTHESIA:   General  DRAINS: JP   SPECIMEN:  None  INDICATION FOR PROCEDURE: 61 year old man with neck and arm pain due to cervical spondylosis with radiculopathy.  I recommended the above procedure. Patient understood the risks, benefits, and alternatives and potential outcomes and wished to proceed.  PROCEDURE DETAILS: Patient was brought to the operating room placed under general endotracheal anesthesia. Patient was placed in the supine position on the operating room table. The neck was prepped with betadine and chloraprep and draped in a sterile fashion.   A transverse incision was made on the right side of the neck. Dissection was carried down thru the subcutaneous tissue and the platysma was elevated, opened vertically, and undermined with Metzenbaum scissors. Dissection was then carried out thru an avascular plane leaving the sternocleidomastoid, carotid artery, and jugular vein laterally and the trachea and esophagus medially. The ventral aspect of the vertebral column was identified and a localizing x-ray was taken. The C4-5 level was identified. The longus colli muscles were then elevated and the retractor was placed. The annulus was incised and the disc space entered. Discectomy was performed with micro-curettes and pituitary and kerrison rongeurs. I then used the high-speed drill to drill the endplates down to the level of the posterior longitudinal ligament. The operating microscope was draped and brought into the field provided additional magnification, illumination and visualization. Utilizing microsurgical technique, discectomy was continued  posteriorly thru the disc space. Posterior longitudinal ligament was opened with a nerve hook, and then removed along with disc herniation and osteophytes, decompressing the spinal canal and thecal sac. We then continued to remove osteophytic overgrowth and disc material decompressing the neural foramina and exiting nerve roots bilaterally. So by both visualization and palpation we felt we had an adequate decompression of the neural elements. We then measured the height of the intravertebral disc space and selected a porous titanium interbody cage packed with DBM. It was then gently positioned in the intravertebral disc space and countersunk.   The superior distraction pin was then removed and bone bleeding was stopped with bone wax. The distraction pin was inserted at the next inferior level. The annulus was incised and the disc space entered. Discectomy was performed with micro-curettes and pituitary and kerrison rongeurs. I then used the high-speed drill to drill the endplates down to the level of the posterior longitudinal ligament. The operating microscope was returned to the field.  The discectomy was continued posteriorly thru the disc space. Posterior longitudinal ligament was opened with a nerve hook, and then removed along with disc herniation and osteophytes, decompressing the spinal canal and thecal sac. We then continued to remove osteophytic overgrowth and disc material decompressing the neural foramina and exiting nerve roots bilaterally. So by both visualization and palpation we felt we had an adequate decompression of the neural elements. We then measured the height of the intravertebral disc space and selected a second interbody cage which was packed with DBM. It was then gently positioned in the intravertebral disc space and countersunk.   The superior distraction pin was then again removed and bone bleeding was stopped with bone wax. The distraction pin was inserted at the most inferior  level. The annulus was incised and the disc space entered. Discectomy was performed with micro-curettes and pituitary and kerrison rongeurs. I then used the high-speed drill to drill the endplates down to the level of the posterior longitudinal ligament. The operating microscope was returned to the field.  The discectomy was continued posteriorly thru the disc space. Posterior longitudinal ligament was opened with a nerve hook, and then removed along with disc herniation and osteophytes, decompressing the spinal canal and thecal sac. We then continued to remove osteophytic overgrowth and disc material decompressing the neural foramina and exiting nerve roots bilaterally. So by both visualization and palpation we felt we had an adequate decompression of the neural elements. We then measured the height of the intravertebral disc space and selected a third interbody cage which was packed with DBM. It was then gently positioned in the intravertebral disc space and countersunk.   I then inserted a titanium plate and placed six variable angle screws into the three superior vertebral bodies and two fixed angle screws at the inferior level and locked them into position. The wound was irrigated with bacitracin solution, checked for hemostasis which was established and confirmed. Once meticulous hemostasis was achieved a medium hemovac drain was placed. The platysma was closed with interrupted 3-0 undyed Vicryl suture, the subcuticular layer was closed with running undyed Vicryl suture. The skin edges were approximated with dermabond. The drapes were removed. A sterile dressing was applied. The patient was then awakened from general anesthesia and transferred to the recovery room in stable condition. At the end of the procedure all sponge, needle and instrument counts were correct.   PATIENT DISPOSITION:  PACU - hemodynamically stable.   Delay start of Pharmacological VTE agent (>24hrs) due to surgical blood loss or  risk of bleeding:  yes

## 2017-01-28 NOTE — Anesthesia Procedure Notes (Signed)
Procedure Name: Intubation Date/Time: 01/28/2017 7:40 AM Performed by: Rachel MouldsLee, Sheryl Saintil B, CRNA Pre-anesthesia Checklist: Patient identified, Emergency Drugs available, Patient being monitored, Timeout performed and Suction available Patient Re-evaluated:Patient Re-evaluated prior to induction Oxygen Delivery Method: Circle system utilized Preoxygenation: Pre-oxygenation with 100% oxygen Induction Type: IV induction Ventilation: Mask ventilation without difficulty Grade View: Grade I Tube type: Oral Tube size: 7.5 mm Number of attempts: 1 Airway Equipment and Method: Stylet and Video-laryngoscopy Placement Confirmation: ETT inserted through vocal cords under direct vision,  positive ETCO2,  CO2 detector and breath sounds checked- equal and bilateral Secured at: 23 cm Tube secured with: Tape Dental Injury: Teeth and Oropharynx as per pre-operative assessment  Comments: Elective glidescope due to cervical radiculopathy

## 2017-01-28 NOTE — H&P (Signed)
CC:  No chief complaint on file. Neck pain, arm pain and numbness  HPI: Andre MulderJohn Rodriguez is a 61 y.o. male with cervical spondylosis with radiculopathy.  He presents for elective C4-7 ACDF.  He denies any changes since clinic.  Symptoms worst on left.  PMH: Past Medical History:  Diagnosis Date  . Complication of anesthesia    some blood pressure issues "dropping"? with back surgery in [place]  . Diabetes mellitus without complication (HCC)   . Hypertension     PSH: Past Surgical History:  Procedure Laterality Date  . BACK SURGERY    . ELBOW SURGERY     fracture surgery  . ESOPHAGOGASTRODUODENOSCOPY (EGD) WITH PROPOFOL N/A 02/22/2016   Procedure: ESOPHAGOGASTRODUODENOSCOPY (EGD) WITH PROPOFOL;  Surgeon: Bernette Redbirdobert Buccini, MD;  Location: [facility] ENDOSCOPY;  Service: Endoscopy;  Laterality: N/A;  . JOINT REPLACEMENT    . KNEE SURGERY Left 1976    SH: Social History   Tobacco Use  . Smoking status: Former Smoker    Last attempt to quit: 05/05/1996    Years since quitting: 20.7  . Smokeless tobacco: Never Used  Substance Use Topics  . Alcohol use: Yes    Comment: 1 wine cooler every once in a while   . Drug use: No    MEDS: Prior to Admission medications   Medication Sig Start Date End Date Taking? Authorizing Provider  diclofenac (VOLTAREN) 75 MG EC tablet Take 75 mg 2 (two) times daily by mouth.   Yes [provider]  gabapentin (NEURONTIN) 300 MG capsule Take 300 mg 2 (two) times daily by mouth.   Yes [provider]  lisinopril (PRINIVIL,ZESTRIL) 20 MG tablet Take 20 mg daily by mouth.   Yes [provider]  metFORMIN (GLUCOPHAGE) 1000 MG tablet Take 500 mg by mouth 2 (two) times daily. 12/11/15  Yes [provider]  simvastatin (ZOCOR) 40 MG tablet Take 40 mg daily by mouth.   Yes [provider]    ALLERGY: Allergies  Allergen Reactions  . Meloxicam Other (See Comments)    Stomach ulcer/bleeding    ROS: ROS  NEUROLOGIC  EXAM: Awake, alert, oriented Memory and concentration grossly intact Speech fluent, appropriate CN grossly intact Motor exam: Upper Extremities Deltoid Bicep Tricep Grip  Right 5/5 5/5 5/5 5/5  Left 5/5 5/5 5/5 5/5   Lower Extremity IP Quad PF DF EHL  Right 5/5 5/5 5/5 5/5 5/5  Left 5/5 5/5 5/5 5/5 5/5   Sensation grossly intact to LT  IMAGING: No new imaging  IMPRESSION: - 61 y.o. male with cervical spondylosis with radiculopathy.  Neuro intact.  PLAN: - C4-7 anterior discectomy with fusion and plate fixation - We have discussed the risks, benefits, and alternatives to surgery and he wishes to proceed.

## 2017-01-28 NOTE — Progress Notes (Signed)
Orthopedic Tech Progress Note Patient Details:  Alan MulderJohn Rieser 11/06/1955 [id number]  Ortho Devices Type of Ortho Device: Soft collar Ortho Device/Splint Interventions: Application   Saul FordyceJennifer C Carmalita Wakefield 01/28/2017, 11:09 AM

## 2017-01-28 NOTE — Anesthesia Postprocedure Evaluation (Signed)
Anesthesia Post Note  Patient: Andre MulderJohn Knieriem  Procedure(s) Performed: Anterior Cervical Discectomy with Fusion and Plate Fixation Cervical Four through Seven (N/A )     Patient location during evaluation: PACU Anesthesia Type: General Level of consciousness: awake and alert Pain management: pain level controlled Vital Signs Assessment: post-procedure vital signs reviewed and stable Respiratory status: spontaneous breathing, nonlabored ventilation and respiratory function stable Cardiovascular status: blood pressure returned to baseline and stable Postop Assessment: no apparent nausea or vomiting Anesthetic complications: no    Last Vitals:  Vitals:   01/28/17 1315 01/28/17 1353  BP: (!) 166/96 (!) 170/99  Pulse: 82 80  Resp: 13 16  Temp:  36.8 C  SpO2: 93% 95%    Last Pain:  Vitals:   01/28/17 1315  TempSrc:   PainSc: Asleep                 Vong Garringer,W. EDMOND

## 2017-01-28 NOTE — Transfer of Care (Signed)
Immediate Anesthesia Transfer of Care Note  Patient: Andre MulderJohn Rodriguez  Procedure(s) Performed: Anterior Cervical Discectomy with Fusion and Plate Fixation Cervical Four through Seven (N/A )  Patient Location: PACU  Anesthesia Type:General  Level of Consciousness: awake and alert   Airway & Oxygen Therapy: Patient Spontanous Breathing and Patient connected to nasal cannula oxygen  Post-op Assessment: Report given to RN, Post -op Vital signs reviewed and stable and Patient moving all extremities X 4  Post vital signs: Reviewed and stable  Last Vitals:  Vitals:   01/28/17 0546 01/28/17 1055  BP: (!) 176/97 (!) 174/86  Pulse: 74 85  Resp: 20 14  Temp: 36.7 C (!) 36.3 C  SpO2: 98% 96%    Last Pain:  Vitals:   01/28/17 0608  TempSrc:   PainSc: 0-No pain      Patients Stated Pain Goal: 4 (01/28/17 [id number])  Complications: No apparent anesthesia complications

## 2017-01-28 NOTE — Evaluation (Signed)
Physical Therapy Evaluation Patient Details Name: Andre MulderJohn Rodriguez MRN: [id number] DOB: 01/11/1956 Today's Date: 01/28/2017   History of Present Illness  patient is a 61 yo male s/p Anterior Cervical Discectomy with Fusion and Plate Fixation Cervical Four through Seven  Clinical Impression  Orders received for PT evaluation. Patient demonstrates deficits in functional mobility as indicated below. Will benefit from continued skilled PT to address deficits and maximize function. Will see as indicated and progress as tolerated.  Will see for stair negotiation and car transfer training in am.    Follow Up Recommendations No PT follow up    Equipment Recommendations  None recommended by PT    Recommendations for Other Services       Precautions / Restrictions Precautions Precautions: Cervical Precaution Comments: provided hand out Restrictions Weight Bearing Restrictions: No      Mobility  Bed Mobility Overal bed mobility: Needs Assistance Bed Mobility: Supine to Sit;Sit to Supine     Supine to sit: Supervision Sit to supine: Supervision   General bed mobility comments: no physical assist required, VCs for technique and positioning  Transfers Overall transfer level: Needs assistance Equipment used: 1 person hand held assist Transfers: Sit to/from Stand Sit to Stand: Min assist         General transfer comment: Min assist for stability  Ambulation/Gait Ambulation/Gait assistance: Min assist Ambulation Distance (Feet): 90 Feet Assistive device: 1 person hand held assist(pushing IV pole) Gait Pattern/deviations: Step-through pattern;Decreased stride length;Drifts right/left Gait velocity: decreased   General Gait Details: patient with some noted instability and dizziness with activity.  Stairs            Wheelchair Mobility    Modified Rankin (Stroke Patients Only)       Balance Overall balance assessment: Needs assistance   Sitting balance-Leahy  Scale: Fair       Standing balance-Leahy Scale: Poor Standing balance comment: required UE support today due to dizziness                              Pertinent Vitals/Pain Pain Assessment: Faces Faces Pain Scale: Hurts little more Pain Location: surgical site Pain Descriptors / Indicators: Sore Pain Intervention(s): Monitored during session    Home Living Family/patient expects to be discharged to:: Private residence Living Arrangements: Spouse/significant other Available Help at Discharge: Family Type of Home: House Home Access: Stairs to enter Entrance Stairs-Rails: None Secretary/administratorntrance Stairs-Number of Steps: 1 Home Layout: Two level;Bed/bath upstairs Home Equipment: None      Prior Function Level of Independence: Independent               Hand Dominance   Dominant Hand: Right    Extremity/Trunk Assessment   Upper Extremity Assessment Upper Extremity Assessment: Defer to OT evaluation    Lower Extremity Assessment Lower Extremity Assessment: Overall WFL for tasks assessed    Cervical / Trunk Assessment Cervical / Trunk Assessment: (s/p cervical surgery)  Communication   Communication: No difficulties  Cognition Arousal/Alertness: Lethargic Behavior During Therapy: Flat affect Overall Cognitive Status: Difficult to assess                                        General Comments      Exercises     Assessment/Plan    PT Assessment Patient needs continued PT services  PT Problem List Decreased activity  tolerance;Decreased balance;Decreased mobility;Decreased knowledge of precautions;Pain       PT Treatment Interventions DME instruction;Gait training;Stair training;Functional mobility training;Therapeutic activities;Therapeutic exercise;Balance training    PT Goals (Current goals can be found in the Care Plan section)  Acute Rehab PT Goals Patient Stated Goal: to go home PT Goal Formulation: With patient/family Time For  Goal Achievement: 02/11/17 Potential to Achieve Goals: Good    Frequency Min 5X/week   Barriers to discharge        Co-evaluation               AM-PAC PT "6 Clicks" Daily Activity  Outcome Measure Difficulty turning over in bed (including adjusting bedclothes, sheets and blankets)?: A Little Difficulty moving from lying on back to sitting on the side of the bed? : A Little Difficulty sitting down on and standing up from a chair with arms (e.g., wheelchair, bedside commode, etc,.)?: Unable Help needed moving to and from a bed to chair (including a wheelchair)?: A Little Help needed walking in hospital room?: A Little Help needed climbing 3-5 steps with a railing? : A Little 6 Click Score: 16    End of Session   Activity Tolerance: Patient limited by fatigue Patient left: in bed;with call bell/phone within reach;with family/visitor present Nurse Communication: Mobility status;Precautions PT Visit Diagnosis: Unsteadiness on feet (R26.81);Difficulty in walking, not elsewhere classified (R26.2)    Time: [id number] PT Time Calculation (min) (ACUTE ONLY): 21 min   Charges:   PT Evaluation $PT Eval Moderate Complexity: 1 Mod     PT G Codes:        Charlotte Crumbevon Tenesha Garza, PT DPT  Board Certified Neurologic Specialist [phone]   Fabio AsaDevon J Shermika Balthaser 01/28/2017, 5:25 PM

## 2017-01-29 ENCOUNTER — Encounter (HOSPITAL_COMMUNITY): Payer: Self-pay | Admitting: Neurological Surgery

## 2017-01-29 LAB — GLUCOSE, CAPILLARY: Glucose-Capillary: 121 mg/dL — ABNORMAL HIGH (ref 65–99)

## 2017-01-29 MED ORDER — METHOCARBAMOL 750 MG PO TABS: 750.0000 mg | ORAL_TABLET | Freq: Four times a day (QID) | ORAL | 2 refills | Status: DC

## 2017-01-29 MED ORDER — ACETAMINOPHEN 325 MG PO TABS: 650.0000 mg | ORAL_TABLET | ORAL | 2 refills | Status: DC | PRN

## 2017-01-29 MED ORDER — OXYCODONE HCL 5 MG PO TABS: 5.0000 mg | ORAL_TABLET | ORAL | 0 refills | Status: DC | PRN

## 2017-01-29 MED ORDER — GABAPENTIN 400 MG PO CAPS: 400.0000 mg | ORAL_CAPSULE | Freq: Three times a day (TID) | ORAL | 2 refills | Status: DC

## 2017-01-29 MED FILL — Gelatin Absorbable MT Powder: OROMUCOSAL | Qty: 1 | Status: AC

## 2017-01-29 MED FILL — Thrombin (Recombinant) For Soln 5000 Unit: CUTANEOUS | Qty: 5000 | Status: AC

## 2017-01-29 NOTE — Progress Notes (Signed)
Pt doing well. Pt and wife given D/C instructions with Rx's, verbal understanding was provided. Pt's incision is clean and dry with no sign of infection. Pt's IV and JP drain were removed prior to D/C. Pt D/C'd home via wheelchair @ 1055 per MD order. Pt is stable @ D/C and has no other needs at this time. Rema FendtAshley Harrison Zetina, RN

## 2017-01-29 NOTE — Progress Notes (Signed)
No acute events Moving arms and legs well Bandage c/d/i D/c home
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
## 2017-01-29 NOTE — Discharge Summary (Signed)
Date of Admission: 01/28/2017  Date of Discharge: 01/29/17  Admission Diagnosis: Cervical spondylosis with radiculopathy  Discharge Diagnosis: Same  Procedure Performed: C4-7 ACDF  Attending: Ditty, Loura HaltBenjamin Jared, MD  Hospital Course:  The patient was admitted for the above listed operation and had an uncomplicated post-operative course.  They were discharged in stable condition.  Follow up: 3 weeks  Allergies as of 01/29/2017      Reactions   Meloxicam Other (See Comments)   Stomach ulcer/bleeding      Medication List    STOP taking these medications   diclofenac 75 MG EC tablet Commonly known as:  VOLTAREN     TAKE these medications   acetaminophen 325 MG tablet Commonly known as:  TYLENOL Take 2 tablets (650 mg total) by mouth every 4 (four) hours as needed for mild pain ((score 1 to 3) or temp > 100.5).   gabapentin 300 MG capsule Commonly known as:  NEURONTIN Take 300 mg 2 (two) times daily by mouth. What changed:  Another medication with the same name was added. Make sure you understand how and when to take each.   gabapentin 400 MG capsule Commonly known as:  NEURONTIN Take 1 capsule (400 mg total) by mouth 3 (three) times daily. What changed:  You were already taking a medication with the same name, and this prescription was added. Make sure you understand how and when to take each.   lisinopril 20 MG tablet Commonly known as:  PRINIVIL,ZESTRIL Take 20 mg daily by mouth.   metFORMIN 1000 MG tablet Commonly known as:  GLUCOPHAGE Take 500 mg by mouth 2 (two) times daily.   methocarbamol 750 MG tablet Commonly known as:  ROBAXIN Take 1 tablet (750 mg total) by mouth 4 (four) times daily.   oxyCODONE 5 MG immediate release tablet Commonly known as:  Oxy IR/ROXICODONE Take 1-2 tablets (5-10 mg total) by mouth every 4 (four) hours as needed (pain).   simvastatin 40 MG tablet Commonly known as:  ZOCOR Take 40 mg daily by mouth.

## 2017-02-15 ENCOUNTER — Emergency Department (HOSPITAL_COMMUNITY)
Admission: EM | Admit: 2017-02-15 | Discharge: 2017-02-15 | Disposition: A | Discharge status: Home / Self Care | Payer: Medicare Other | Attending: Emergency Medicine | Admitting: Emergency Medicine

## 2017-02-15 ENCOUNTER — Encounter (HOSPITAL_COMMUNITY): Payer: Self-pay

## 2017-02-15 ENCOUNTER — Emergency Department (HOSPITAL_COMMUNITY): Payer: Medicare Other

## 2017-02-15 DIAGNOSIS — Z7984 Long term (current) use of oral hypoglycemic drugs: Secondary | ICD-10-CM | POA: Diagnosis not present

## 2017-02-15 DIAGNOSIS — Z87891 Personal history of nicotine dependence: Secondary | ICD-10-CM | POA: Insufficient documentation

## 2017-02-15 DIAGNOSIS — E119 Type 2 diabetes mellitus without complications: Secondary | ICD-10-CM | POA: Insufficient documentation

## 2017-02-15 DIAGNOSIS — I10 Essential (primary) hypertension: Secondary | ICD-10-CM | POA: Insufficient documentation

## 2017-02-15 DIAGNOSIS — Z79899 Other long term (current) drug therapy: Secondary | ICD-10-CM | POA: Diagnosis not present

## 2017-02-15 DIAGNOSIS — M549 Dorsalgia, unspecified: Secondary | ICD-10-CM | POA: Insufficient documentation

## 2017-02-15 DIAGNOSIS — M542 Cervicalgia: Secondary | ICD-10-CM | POA: Diagnosis not present

## 2017-02-15 DIAGNOSIS — G8918 Other acute postprocedural pain: Secondary | ICD-10-CM

## 2017-02-15 MED ORDER — HYDROMORPHONE HCL 1 MG/ML IJ SOLN
1.0000 mg | Freq: Once | INTRAMUSCULAR | Status: AC
  Administered 2017-02-15: 1 mg via INTRAMUSCULAR
  Filled 2017-02-15: qty 1

## 2017-02-15 NOTE — Discharge Instructions (Signed)
FOLLOW UP WITH YOUR NEUROSURGEON THIS WEEK.  WEAR YOUR COLLAR AS DIRECTED.  RETURN FOR ANY WORSENING PAIN, NUMBNESS OR WEAKNESS TO YOUR EXTREMITIES.
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
## 2017-02-15 NOTE — ED Notes (Signed)
Patient transported to CT 

## 2017-02-15 NOTE — Progress Notes (Signed)
Orthopedic Tech Progress Note Patient Details:  Andre MulderJohn Rodriguez 06/15/1955 [id number]  Ortho Devices Type of Ortho Device: Soft collar Ortho Device/Splint Interventions: Application   Post Interventions Patient Tolerated: Well Instructions Provided: Care of device   Saul FordyceJennifer C Aloma Rodriguez 02/15/2017, 11:50 AM

## 2017-02-20 DIAGNOSIS — M4722 Other spondylosis with radiculopathy, cervical region: Secondary | ICD-10-CM | POA: Diagnosis not present

## 2017-03-01 ENCOUNTER — Emergency Department (HOSPITAL_COMMUNITY)
Admission: EM | Admit: 2017-03-01 | Discharge: 2017-03-01 | Disposition: A | Discharge status: Home / Self Care | Payer: Medicare Other | Attending: Emergency Medicine | Admitting: Emergency Medicine

## 2017-03-01 ENCOUNTER — Encounter (HOSPITAL_COMMUNITY): Payer: Self-pay

## 2017-03-01 DIAGNOSIS — Z87891 Personal history of nicotine dependence: Secondary | ICD-10-CM | POA: Insufficient documentation

## 2017-03-01 DIAGNOSIS — E119 Type 2 diabetes mellitus without complications: Secondary | ICD-10-CM | POA: Insufficient documentation

## 2017-03-01 DIAGNOSIS — Z79899 Other long term (current) drug therapy: Secondary | ICD-10-CM | POA: Diagnosis not present

## 2017-03-01 DIAGNOSIS — I10 Essential (primary) hypertension: Secondary | ICD-10-CM | POA: Diagnosis not present

## 2017-03-01 DIAGNOSIS — M542 Cervicalgia: Secondary | ICD-10-CM | POA: Diagnosis not present

## 2017-03-01 DIAGNOSIS — R1310 Dysphagia, unspecified: Secondary | ICD-10-CM | POA: Diagnosis not present

## 2017-03-01 MED ORDER — OMEPRAZOLE 20 MG PO CPDR: 20.0000 mg | DELAYED_RELEASE_CAPSULE | Freq: Two times a day (BID) | ORAL | 0 refills | Status: DC
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
## 2017-03-01 NOTE — Discharge Instructions (Signed)
Please call and follow up with Dr. Matthias HughsBuccini for further evaluation of your trouble swallowing.  Take prilosec as prescribed.  Return if you have any concerns

## 2017-03-01 NOTE — ED Notes (Signed)
PA requesting to PO challenge patient. Patient CN 9, 12 intact. Lungs sounds clear throughout, bilaterally before and after ingestion of water and graham crackers. Patient had no issues with swallowing fluid from cup and through straw. Patient notes discomforting sensation of food scraping down to stomach when swallowing graham crackers. NAD noted.

## 2017-03-01 NOTE — ED Notes (Signed)
Declined W/C at D/C and was escorted to lobby by RN. 
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
## 2017-03-01 NOTE — ED Provider Notes (Signed)
Medical screening examination/treatment/procedure(s) were conducted as a shared visit with non-physician practitioner(s) and myself.  I personally evaluated the patient during the encounter.   EKG Interpretation None        Arby BarrettePfeiffer, Mariyam Remington, MD 03/01/17 1127

## 2017-03-03 DIAGNOSIS — G8929 Other chronic pain: Secondary | ICD-10-CM | POA: Diagnosis not present

## 2017-03-03 DIAGNOSIS — M545 Low back pain: Secondary | ICD-10-CM | POA: Diagnosis not present

## 2017-03-03 DIAGNOSIS — M5010 Cervical disc disorder with radiculopathy, unspecified cervical region: Secondary | ICD-10-CM | POA: Diagnosis not present

## 2017-03-03 DIAGNOSIS — M542 Cervicalgia: Secondary | ICD-10-CM | POA: Diagnosis not present

## 2017-03-29 DIAGNOSIS — Z833 Family history of diabetes mellitus: Secondary | ICD-10-CM | POA: Diagnosis not present

## 2017-03-29 DIAGNOSIS — B078 Other viral warts: Secondary | ICD-10-CM | POA: Diagnosis not present

## 2017-04-06 DIAGNOSIS — K056 Periodontal disease, unspecified: Secondary | ICD-10-CM | POA: Diagnosis not present

## 2017-04-06 DIAGNOSIS — I10 Essential (primary) hypertension: Secondary | ICD-10-CM | POA: Diagnosis not present

## 2017-04-06 DIAGNOSIS — K029 Dental caries, unspecified: Secondary | ICD-10-CM | POA: Diagnosis not present

## 2017-04-16 DIAGNOSIS — M4722 Other spondylosis with radiculopathy, cervical region: Secondary | ICD-10-CM | POA: Diagnosis not present

## 2017-04-23 DIAGNOSIS — L819 Disorder of pigmentation, unspecified: Secondary | ICD-10-CM | POA: Diagnosis not present

## 2017-04-23 DIAGNOSIS — L720 Epidermal cyst: Secondary | ICD-10-CM | POA: Diagnosis not present

## 2017-07-23 ENCOUNTER — Encounter (HOSPITAL_COMMUNITY): Payer: Self-pay

## 2017-07-23 ENCOUNTER — Emergency Department (HOSPITAL_COMMUNITY)
Admission: EM | Admit: 2017-07-23 | Discharge: 2017-07-23 | Disposition: A | Discharge status: Home / Self Care | Payer: Medicare HMO | Attending: Emergency Medicine | Admitting: Emergency Medicine

## 2017-07-23 ENCOUNTER — Other Ambulatory Visit: Payer: Self-pay

## 2017-07-23 DIAGNOSIS — Z7984 Long term (current) use of oral hypoglycemic drugs: Secondary | ICD-10-CM | POA: Diagnosis not present

## 2017-07-23 DIAGNOSIS — Z79899 Other long term (current) drug therapy: Secondary | ICD-10-CM | POA: Diagnosis not present

## 2017-07-23 DIAGNOSIS — K047 Periapical abscess without sinus: Secondary | ICD-10-CM | POA: Diagnosis not present

## 2017-07-23 DIAGNOSIS — E119 Type 2 diabetes mellitus without complications: Secondary | ICD-10-CM | POA: Insufficient documentation

## 2017-07-23 DIAGNOSIS — I10 Essential (primary) hypertension: Secondary | ICD-10-CM | POA: Insufficient documentation

## 2017-07-23 DIAGNOSIS — Z87891 Personal history of nicotine dependence: Secondary | ICD-10-CM | POA: Insufficient documentation

## 2017-07-23 DIAGNOSIS — K0889 Other specified disorders of teeth and supporting structures: Secondary | ICD-10-CM | POA: Diagnosis present

## 2017-07-23 MED ORDER — ACETAMINOPHEN 500 MG PO TABS
1000.0000 mg | ORAL_TABLET | Freq: Once | ORAL | Status: AC
  Administered 2017-07-23: 1000 mg via ORAL
  Filled 2017-07-23: qty 2

## 2017-07-23 MED ORDER — PENICILLIN V POTASSIUM 500 MG PO TABS: 500.0000 mg | ORAL_TABLET | Freq: Four times a day (QID) | ORAL | 0 refills | Status: DC

## 2017-07-23 MED ORDER — PENICILLIN V POTASSIUM 250 MG PO TABS
500.0000 mg | ORAL_TABLET | Freq: Once | ORAL | Status: AC
  Administered 2017-07-23: 500 mg via ORAL
  Filled 2017-07-23: qty 2

## 2017-07-23 NOTE — ED Triage Notes (Signed)
Pt states that for the past two days he has had pain on the L bottom side of his mouth.

## 2017-07-23 NOTE — ED Provider Notes (Signed)
[facility] EMERGENCY DEPARTMENT Provider Note   CSN: [id number] Arrival date & time: 07/23/17  4098     History   Chief Complaint Chief Complaint  Patient presents with  . Dental Pain    HPI Drago Hammonds is a 62 y.o. male.  62 year old male with past medical history including hypertension, type 2 diabetes mellitus, GI bleed who presents with dental pain.  For the past 2 days, the patient has had progressively worsening left lower tooth pain and some swelling.  Pain is severe and constant.  He has not recently seen a dentist but has seen one in the past.  He denies any swallowing problems, breathing problems, or fever.  The history is provided by the patient.  Dental Pain      Past Medical History:  Diagnosis Date  . Complication of anesthesia    some blood pressure issues "dropping"? with back surgery in [place]  . Diabetes mellitus without complication (HCC)   . Hypertension     Patient Active Problem List   Diagnosis Date Noted  . Cervical spondylosis with radiculopathy 01/28/2017  . GIB (gastrointestinal bleeding) 02/23/2016  . DM (diabetes mellitus) (HCC) 02/22/2016  . HTN (hypertension) 02/22/2016  . AKI (acute kidney injury) (HCC) 02/22/2016  . Acute blood loss anemia 02/22/2016  . Acute GI bleeding 02/22/2016    Past Surgical History:  Procedure Laterality Date  . ANTERIOR CERVICAL DECOMP/DISCECTOMY FUSION N/A 01/28/2017   Procedure: Anterior Cervical Discectomy with Fusion and Plate Fixation Cervical Four through Seven;  Surgeon: Ditty, Loura Halt, MD;  Location: [facility] OR;  Service: Neurosurgery;  Laterality: N/A;   Anterior Cervical Discectomy with Fusion and Plate Fixation Cervical Four through Seven  . BACK SURGERY    . ELBOW SURGERY     fracture surgery  . ESOPHAGOGASTRODUODENOSCOPY (EGD) WITH PROPOFOL N/A 02/22/2016   Procedure: ESOPHAGOGASTRODUODENOSCOPY (EGD) WITH PROPOFOL;  Surgeon: Bernette Redbird, MD;  Location: [facility] ENDOSCOPY;   Service: Endoscopy;  Laterality: N/A;  . JOINT REPLACEMENT    . KNEE SURGERY Left 1976        Home Medications    Prior to Admission medications   Medication Sig Start Date End Date Taking? Authorizing Provider  acetaminophen (TYLENOL) 325 MG tablet Take 2 tablets (650 mg total) by mouth every 4 (four) hours as needed for mild pain ((score 1 to 3) or temp > 100.5). 01/29/17   Ditty, Loura Halt, MD  gabapentin (NEURONTIN) 400 MG capsule Take 1 capsule (400 mg total) by mouth 3 (three) times daily. 01/29/17   Ditty, Loura Halt, MD  lisinopril (PRINIVIL,ZESTRIL) 20 MG tablet Take 20 mg daily by mouth.    [provider]  metFORMIN (GLUCOPHAGE) 1000 MG tablet Take 500 mg by mouth 2 (two) times daily. 12/11/15   [provider]  methocarbamol (ROBAXIN) 750 MG tablet Take 1 tablet (750 mg total) by mouth 4 (four) times daily. 01/29/17   Ditty, Loura Halt, MD  omeprazole (PRILOSEC) 20 MG capsule Take 1 capsule (20 mg total) by mouth 2 (two) times daily before a meal. 03/01/17   Fayrene Helper, PA-C  oxyCODONE (OXY IR/ROXICODONE) 5 MG immediate release tablet Take 1-2 tablets (5-10 mg total) by mouth every 4 (four) hours as needed (pain). 01/29/17   Ditty, Loura Halt, MD  penicillin v potassium (VEETID) 500 MG tablet Take 1 tablet (500 mg total) by mouth 4 (four) times daily. 07/23/17   Demetrion Wesby, Ambrose Finland, MD  simvastatin (ZOCOR) 40 MG tablet Take 40 mg daily  by mouth.    [provider]    Family History No family history on file.  Social History Social History   Tobacco Use  . Smoking status: Former Smoker    Last attempt to quit: 05/05/1996    Years since quitting: 21.2  . Smokeless tobacco: Never Used  Substance Use Topics  . Alcohol use: Yes    Comment: 1 wine cooler every once in a while   . Drug use: No     Allergies   Meloxicam   Review of Systems Review of Systems  Constitutional: Negative for fever.  HENT: Positive for dental  problem and facial swelling. Negative for trouble swallowing.   Respiratory: Negative for shortness of breath.   Skin: Negative for rash.     Physical Exam Updated Vital Signs BP (!) 236/118   Pulse 71   Temp 98.4 F (36.9 C) (Oral)   Resp 16   Ht 5' 10.5" (1.791 m)   Wt 108.9 kg (240 lb)   SpO2 95%   BMI 33.95 kg/m   Physical Exam  Constitutional: He is oriented to person, place, and time. He appears well-developed and well-nourished. No distress.  Uncomfortable, holding L face  HENT:  Head: Normocephalic and atraumatic.  Poor dentition, mild swelling along outer gumline of left lower premolars/molars; mild swelling and tenderness along L mandible, no extension of swelling into oropharynx or neck  Eyes: Conjunctivae are normal.  Neck: Neck supple. No tracheal deviation present.  Pulmonary/Chest: No stridor.  Neurological: He is alert and oriented to person, place, and time.  Skin: Skin is warm and dry.  Psychiatric: He has a normal mood and affect. Judgment normal.  Nursing note and vitals reviewed.    ED Treatments / Results  Labs (all labs ordered are listed, but only abnormal results are displayed) Labs Reviewed - No data to display  EKG None  Radiology No results found.  Procedures Procedures (including critical care time)  Medications Ordered in ED Medications  penicillin v potassium (VEETID) tablet 500 mg (has no administration in time range)  acetaminophen (TYLENOL) tablet 1,000 mg (has no administration in time range)     Initial Impression / Assessment and Plan / ED Course  I have reviewed the triage vital signs and the nursing notes.     Suspect dental abscess based on exam, no signs/sx of airway compromise or swallowing difficulty. No significant external swelling to warrant CT imaging currently but I have extensively reviewed return precautions with patient. Started on penicillin and explained he would need to f/u with a dentist ASAP for  definitive management. Pt incidentally noted to have HTN at triage. He admits to not taking medication yesterday and today due to pain. States he has medication at home. I have emphasized need for medication compliance and close PCP f/u for recheck of BP.  Final Clinical Impressions(s) / ED Diagnoses   Final diagnoses:  Dental infection  Essential hypertension    ED Discharge Orders        Ordered    penicillin v potassium (VEETID) 500 MG tablet  4 times daily     07/23/17 0622       Caretha Rumbaugh, Ambrose Finland, MD 07/23/17 [phone]

## 2017-08-10 ENCOUNTER — Emergency Department (HOSPITAL_COMMUNITY): Payer: Medicare HMO

## 2017-08-10 ENCOUNTER — Other Ambulatory Visit: Payer: Self-pay

## 2017-08-10 ENCOUNTER — Encounter (HOSPITAL_COMMUNITY): Payer: Self-pay | Admitting: Emergency Medicine

## 2017-08-10 ENCOUNTER — Emergency Department (HOSPITAL_COMMUNITY)
Admission: EM | Admit: 2017-08-10 | Discharge: 2017-08-10 | Disposition: A | Discharge status: Home / Self Care | Payer: Medicare HMO | Attending: Emergency Medicine | Admitting: Emergency Medicine

## 2017-08-10 DIAGNOSIS — Y9389 Activity, other specified: Secondary | ICD-10-CM | POA: Insufficient documentation

## 2017-08-10 DIAGNOSIS — S161XXA Strain of muscle, fascia and tendon at neck level, initial encounter: Secondary | ICD-10-CM

## 2017-08-10 DIAGNOSIS — Y999 Unspecified external cause status: Secondary | ICD-10-CM | POA: Insufficient documentation

## 2017-08-10 DIAGNOSIS — Z87891 Personal history of nicotine dependence: Secondary | ICD-10-CM | POA: Diagnosis not present

## 2017-08-10 DIAGNOSIS — I10 Essential (primary) hypertension: Secondary | ICD-10-CM | POA: Insufficient documentation

## 2017-08-10 DIAGNOSIS — Y92411 Interstate highway as the place of occurrence of the external cause: Secondary | ICD-10-CM | POA: Diagnosis not present

## 2017-08-10 DIAGNOSIS — Z79899 Other long term (current) drug therapy: Secondary | ICD-10-CM | POA: Insufficient documentation

## 2017-08-10 DIAGNOSIS — E119 Type 2 diabetes mellitus without complications: Secondary | ICD-10-CM | POA: Diagnosis not present

## 2017-08-10 DIAGNOSIS — Z7984 Long term (current) use of oral hypoglycemic drugs: Secondary | ICD-10-CM | POA: Insufficient documentation

## 2017-08-10 MED ORDER — CYCLOBENZAPRINE HCL 10 MG PO TABS: 10.0000 mg | ORAL_TABLET | Freq: Two times a day (BID) | ORAL | 0 refills | Status: AC | PRN

## 2017-08-10 MED ORDER — ACETAMINOPHEN 500 MG PO TABS
1000.0000 mg | ORAL_TABLET | Freq: Once | ORAL | Status: AC
  Administered 2017-08-10: 1000 mg via ORAL
  Filled 2017-08-10: qty 2
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
## 2017-08-10 NOTE — ED Notes (Signed)
Patient transported to X-ray 

## 2017-08-10 NOTE — ED Notes (Signed)
Patient verbalizes understanding of discharge instructions. Opportunity for questioning and answers were provided. Armband removed by staff, pt discharged from ED ambulatory.   

## 2018-04-06 ENCOUNTER — Encounter (HOSPITAL_COMMUNITY): Payer: Self-pay | Admitting: *Deleted

## 2018-04-06 ENCOUNTER — Emergency Department (HOSPITAL_COMMUNITY)
Admission: EM | Admit: 2018-04-06 | Discharge: 2018-04-06 | Disposition: A | Discharge status: Home / Self Care | Payer: Medicare HMO | Attending: Emergency Medicine | Admitting: Emergency Medicine

## 2018-04-06 ENCOUNTER — Emergency Department (HOSPITAL_COMMUNITY): Payer: Medicare HMO

## 2018-04-06 DIAGNOSIS — Z79899 Other long term (current) drug therapy: Secondary | ICD-10-CM | POA: Insufficient documentation

## 2018-04-06 DIAGNOSIS — Z96652 Presence of left artificial knee joint: Secondary | ICD-10-CM | POA: Insufficient documentation

## 2018-04-06 DIAGNOSIS — Z87891 Personal history of nicotine dependence: Secondary | ICD-10-CM | POA: Diagnosis not present

## 2018-04-06 DIAGNOSIS — E119 Type 2 diabetes mellitus without complications: Secondary | ICD-10-CM | POA: Diagnosis not present

## 2018-04-06 DIAGNOSIS — Z7984 Long term (current) use of oral hypoglycemic drugs: Secondary | ICD-10-CM | POA: Insufficient documentation

## 2018-04-06 DIAGNOSIS — I10 Essential (primary) hypertension: Secondary | ICD-10-CM | POA: Insufficient documentation

## 2018-04-06 DIAGNOSIS — K625 Hemorrhage of anus and rectum: Secondary | ICD-10-CM | POA: Diagnosis present

## 2018-04-06 DIAGNOSIS — R109 Unspecified abdominal pain: Secondary | ICD-10-CM | POA: Insufficient documentation

## 2018-04-06 LAB — COMPREHENSIVE METABOLIC PANEL
ALK PHOS: 54 U/L (ref 38–126)
ALT: 18 U/L (ref 0–44)
AST: 30 U/L (ref 15–41)
Albumin: 3.7 g/dL (ref 3.5–5.0)
Anion gap: 12 (ref 5–15)
BUN: 12 mg/dL (ref 8–23)
CO2: 22 mmol/L (ref 22–32)
Calcium: 8.8 mg/dL — ABNORMAL LOW (ref 8.9–10.3)
Chloride: 107 mmol/L (ref 98–111)
Creatinine, Ser: 1.48 mg/dL — ABNORMAL HIGH (ref 0.61–1.24)
GFR calc Af Amer: 58 mL/min — ABNORMAL LOW (ref >60)
GFR calc non Af Amer: 50 mL/min — ABNORMAL LOW (ref >60)
Glucose, Bld: 174 mg/dL — ABNORMAL HIGH (ref 70–99)
Potassium: 3.8 mmol/L (ref 3.5–5.1)
Sodium: 141 mmol/L (ref 135–145)
Total Bilirubin: 0.7 mg/dL (ref 0.3–1.2)
Total Protein: 7.1 g/dL (ref 6.5–8.1)

## 2018-04-06 LAB — CBC
HCT: 41.7 % (ref 39.0–52.0)
Hemoglobin: 13.5 g/dL (ref 13.0–17.0)
MCH: 30.5 pg (ref 26.0–34.0)
MCHC: 32.4 g/dL (ref 30.0–36.0)
MCV: 94.1 fL (ref 80.0–100.0)
Platelets: 271 10*3/uL (ref 150–400)
RBC: 4.43 MIL/uL (ref 4.22–5.81)
RDW: 13.8 % (ref 11.5–15.5)
WBC: 6.6 10*3/uL (ref 4.0–10.5)
nRBC: 0 % (ref 0.0–0.2)

## 2018-04-06 LAB — POC OCCULT BLOOD, ED: Fecal Occult Bld: POSITIVE — AB

## 2018-04-06 LAB — CBC WITH DIFFERENTIAL/PLATELET
Abs Immature Granulocytes: 0.02 10*3/uL (ref 0.00–0.07)
Basophils Absolute: 0 10*3/uL (ref 0.0–0.1)
Basophils Relative: 0 %
Eosinophils Absolute: 0.2 10*3/uL (ref 0.0–0.5)
Eosinophils Relative: 2 %
HCT: 42.1 % (ref 39.0–52.0)
HEMOGLOBIN: 13 g/dL (ref 13.0–17.0)
Immature Granulocytes: 0 %
Lymphocytes Relative: 35 %
Lymphs Abs: 2.7 10*3/uL (ref 0.7–4.0)
MCH: 29.1 pg (ref 26.0–34.0)
MCHC: 30.9 g/dL (ref 30.0–36.0)
MCV: 94.2 fL (ref 80.0–100.0)
MONOS PCT: 9 %
Monocytes Absolute: 0.7 10*3/uL (ref 0.1–1.0)
NEUTROS PCT: 54 %
Neutro Abs: 4 10*3/uL (ref 1.7–7.7)
Platelets: 254 10*3/uL (ref 150–400)
RBC: 4.47 MIL/uL (ref 4.22–5.81)
RDW: 13.7 % (ref 11.5–15.5)
WBC: 7.6 10*3/uL (ref 4.0–10.5)
nRBC: 0 % (ref 0.0–0.2)

## 2018-04-06 LAB — TYPE AND SCREEN
ABO/RH(D): O POS
Antibody Screen: NEGATIVE

## 2018-04-06 MED ORDER — IOHEXOL 300 MG/ML  SOLN
100.0000 mL | Freq: Once | INTRAMUSCULAR | Status: AC | PRN
  Administered 2018-04-06: 100 mL via INTRAVENOUS

## 2018-04-06 MED ORDER — LISINOPRIL 20 MG PO TABS
20.0000 mg | ORAL_TABLET | Freq: Once | ORAL | Status: AC
  Administered 2018-04-06: 20 mg via ORAL
  Filled 2018-04-06: qty 1

## 2018-04-06 NOTE — ED Notes (Signed)
Pt stated that he felt "a little lightheaded" while standing for 3 minutes and while sitting up.
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
## 2018-04-06 NOTE — ED Notes (Signed)
Pt to CT at this time.

## 2018-04-06 NOTE — ED Notes (Signed)
Patient transported to CT 

## 2018-04-06 NOTE — Discharge Instructions (Signed)
Please follow-up with your primary care physician for evaluation for possible internal hemorrhoids.

## 2018-04-06 NOTE — ED Triage Notes (Signed)
Pt in c/o possible GI bleed since this am, reports mild abd cramping
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
## 2018-09-22 ENCOUNTER — Emergency Department (HOSPITAL_COMMUNITY): Payer: Medicare Other

## 2018-09-22 ENCOUNTER — Inpatient Hospital Stay (HOSPITAL_COMMUNITY)
Admission: EM | Admit: 2018-09-22 | Discharge: 2018-09-26 | DRG: 177 | Disposition: A | Discharge status: Home / Self Care | Payer: Medicare Other | Attending: Internal Medicine | Admitting: Internal Medicine

## 2018-09-22 ENCOUNTER — Other Ambulatory Visit: Payer: Self-pay

## 2018-09-22 ENCOUNTER — Encounter (HOSPITAL_COMMUNITY): Payer: Self-pay

## 2018-09-22 DIAGNOSIS — U071 COVID-19: Principal | ICD-10-CM | POA: Diagnosis present

## 2018-09-22 DIAGNOSIS — Z7984 Long term (current) use of oral hypoglycemic drugs: Secondary | ICD-10-CM

## 2018-09-22 DIAGNOSIS — I10 Essential (primary) hypertension: Secondary | ICD-10-CM | POA: Diagnosis present

## 2018-09-22 DIAGNOSIS — I129 Hypertensive chronic kidney disease with stage 1 through stage 4 chronic kidney disease, or unspecified chronic kidney disease: Secondary | ICD-10-CM | POA: Diagnosis present

## 2018-09-22 DIAGNOSIS — E1122 Type 2 diabetes mellitus with diabetic chronic kidney disease: Secondary | ICD-10-CM | POA: Diagnosis present

## 2018-09-22 DIAGNOSIS — N179 Acute kidney failure, unspecified: Secondary | ICD-10-CM | POA: Diagnosis present

## 2018-09-22 DIAGNOSIS — N170 Acute kidney failure with tubular necrosis: Secondary | ICD-10-CM | POA: Diagnosis present

## 2018-09-22 DIAGNOSIS — E119 Type 2 diabetes mellitus without complications: Secondary | ICD-10-CM

## 2018-09-22 DIAGNOSIS — Z981 Arthrodesis status: Secondary | ICD-10-CM

## 2018-09-22 DIAGNOSIS — Z87891 Personal history of nicotine dependence: Secondary | ICD-10-CM

## 2018-09-22 DIAGNOSIS — E875 Hyperkalemia: Secondary | ICD-10-CM | POA: Diagnosis present

## 2018-09-22 DIAGNOSIS — N183 Chronic kidney disease, stage 3 unspecified: Secondary | ICD-10-CM | POA: Diagnosis present

## 2018-09-22 DIAGNOSIS — T464X5A Adverse effect of angiotensin-converting-enzyme inhibitors, initial encounter: Secondary | ICD-10-CM | POA: Diagnosis present

## 2018-09-22 DIAGNOSIS — E871 Hypo-osmolality and hyponatremia: Secondary | ICD-10-CM

## 2018-09-22 DIAGNOSIS — E860 Dehydration: Secondary | ICD-10-CM | POA: Diagnosis present

## 2018-09-22 MED ORDER — ACETAMINOPHEN 500 MG PO TABS
1000.0000 mg | ORAL_TABLET | Freq: Once | ORAL | Status: AC
  Administered 2018-09-22: 1000 mg via ORAL
  Filled 2018-09-22: qty 2

## 2018-09-22 NOTE — ED Notes (Signed)
Provided patient for urinal.

## 2018-09-22 NOTE — ED Triage Notes (Signed)
Pt was tested for COVID today but no results yet, he wants to be evaluated for his symptoms today Pt complains of being tired and sleeping a lot, and having a dry cough
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
## 2018-09-22 NOTE — ED Provider Notes (Signed)
[facility]-EMERGENCY DEPT Provider Note   CSN: [id number] Arrival date & time: 09/22/18  1929    History   Chief Complaint Chief Complaint  Patient presents with  . Fatigue  . Cough    HPI Andre Rodriguez is a 63 y.o. male with a history of diabetes mellitus, gastrointestinal bleeding, cervical spondylosis with radiculopathy, hypertension who presents to the emergency department with a chief complaint of lightheadedness, and pain in his left shoulder for the last 4 days.  He also notes that he has been having polyuria and polydipsia and "getting up to pee every hour throughout the night" for the last 4 days.  He also reports that he has been very fatigued and has barely gotten out of bed over the last 2 days.  Yesterday, he reports that he developed a nonproductive cough.  Today, he developed chills.  He was unaware that he had a fever until he arrived in the ER.  He has a history of diabetes and has been compliant with his home medication, but reports that he does not check his blood sugar at home.  He denies shortness of breath, chest pain, neck pain, abdominal pain, nausea, vomiting, diarrhea, syncope, or headache.  He reports that he has worked with several coworkers who have been out of [place], but the patient is unsure if any of them have actually been tested.  Andre Rodriguez was evaluated in Emergency Department on 09/23/2018 for the symptoms described in the history of present illness. He was evaluated in the context of the global COVID-19 pandemic, which necessitated consideration that the patient might be at risk for infection with the SARS-CoV-2 virus that causes COVID-19. Institutional protocols and algorithms that pertain to the evaluation of patients at risk for COVID-19 are in a state of rapid change based on information released by regulatory bodies including the CDC and federal and state organizations. These policies and algorithms were followed  during the patient's care in the ED.     The history is provided by the patient. No language interpreter was used.    Past Medical History:  Diagnosis Date  . Complication of anesthesia    some blood pressure issues "dropping"? with back surgery in [place]  . Diabetes mellitus without complication (HCC)   . Hypertension     Patient Active Problem List   Diagnosis Date Noted  . Cervical spondylosis with radiculopathy 01/28/2017  . GIB (gastrointestinal bleeding) 02/23/2016  . DM (diabetes mellitus) (HCC) 02/22/2016  . HTN (hypertension) 02/22/2016  . AKI (acute kidney injury) (HCC) 02/22/2016  . Acute blood loss anemia 02/22/2016  . Acute GI bleeding 02/22/2016    Past Surgical History:  Procedure Laterality Date  . ANTERIOR CERVICAL DECOMP/DISCECTOMY FUSION N/A 01/28/2017   Procedure: Anterior Cervical Discectomy with Fusion and Plate Fixation Cervical Four through Seven;  Surgeon: Ditty, Loura HaltBenjamin Jared, MD;  [facility];  Service: Neurosurgery;  Laterality: N/A;   Anterior Cervical Discectomy with Fusion and Plate Fixation Cervical Four through Seven  . BACK SURGERY    . ELBOW SURGERY     fracture surgery  . ESOPHAGOGASTRODUODENOSCOPY (EGD) WITH PROPOFOL N/A 02/22/2016   Procedure: ESOPHAGOGASTRODUODENOSCOPY (EGD) WITH PROPOFOL;  Surgeon: Bernette Redbirdobert Buccini, MD;  Location: [facility] ENDOSCOPY;  Service: Endoscopy;  Laterality: N/A;  . JOINT REPLACEMENT    . KNEE SURGERY Left 1976        Home Medications    Prior to Admission medications   Medication Sig Start Date End Date Taking?  Authorizing Provider  lisinopril (PRINIVIL,ZESTRIL) 20 MG tablet Take 20 mg daily by mouth.   Yes [provider]  metFORMIN (GLUCOPHAGE-XR) 500 MG 24 hr tablet Take 1,000 mg by mouth 2 (two) times daily with a meal. 12/03/17  Yes [provider]  gabapentin (NEURONTIN) 400 MG capsule Take 1 capsule (400 mg total) by mouth 3 (three) times daily. Patient not taking: Reported on 04/06/2018  01/29/17 09/23/18  Ditty, Loura HaltBenjamin Jared, MD  omeprazole (PRILOSEC) 20 MG capsule Take 1 capsule (20 mg total) by mouth 2 (two) times daily before a meal. Patient not taking: Reported on 04/06/2018 03/01/17 09/23/18  Fayrene Helperran, [place], PA-C    Family History History reviewed. No pertinent family history.  Social History Social History   Tobacco Use  . Smoking status: Former Smoker    Quit date: 05/05/1996    Years since quitting: 22.4  . Smokeless tobacco: Never Used  Substance Use Topics  . Alcohol use: Yes    Comment: 1 wine cooler every once in a while   . Drug use: No     Allergies   Nsaids, Shellfish-derived products, Aspirin, Meloxicam, and Tramadol   Review of Systems Review of Systems  Constitutional: Positive for chills, fatigue and fever. Negative for appetite change.  HENT: Negative for congestion and sore throat.   Respiratory: Positive for cough. Negative for shortness of breath and wheezing.   Cardiovascular: Negative for chest pain, palpitations and leg swelling.  Gastrointestinal: Negative for abdominal pain, diarrhea, nausea and vomiting.  Endocrine: Positive for polydipsia and polyuria.  Genitourinary: Negative for dysuria, frequency, hematuria and urgency.  Musculoskeletal: Positive for arthralgias and myalgias. Negative for back pain and neck stiffness.  Skin: Negative for rash and wound.  Allergic/Immunologic: Negative for immunocompromised state.  Neurological: Negative for dizziness, seizures, syncope, weakness, numbness and headaches.  Psychiatric/Behavioral: Negative for confusion.     Physical Exam Updated Vital Signs BP 107/71   Pulse 83   Temp 98.9 F (37.2 C)   Resp 16   Ht 5\' 11"  (1.803 m)   Wt 111.1 kg   SpO2 97%   BMI 34.17 kg/m   Physical Exam Vitals signs and nursing note reviewed.  Constitutional:      General: He is not in acute distress.    Appearance: He is well-developed. He is not ill-appearing, toxic-appearing or diaphoretic.   HENT:     Head: Normocephalic.  Eyes:     Conjunctiva/sclera: Conjunctivae normal.  Neck:     Musculoskeletal: Neck supple.  Cardiovascular:     Rate and Rhythm: Normal rate and regular rhythm.     Heart sounds: Normal heart sounds. No murmur. No friction rub. No gallop.   Pulmonary:     Effort: Pulmonary effort is normal. No respiratory distress.     Breath sounds: No stridor. No wheezing, rhonchi or rales.  Chest:     Chest wall: No tenderness.  Abdominal:     General: There is no distension.     Palpations: Abdomen is soft. There is no mass.     Tenderness: There is no abdominal tenderness. There is no right CVA tenderness, left CVA tenderness, guarding or rebound.     Hernia: No hernia is present.     Comments: Abdomen is soft, nontender, nondistended.  Normoactive bowel sounds.  No CVA tenderness bilaterally.  Musculoskeletal:     Right lower leg: No edema.     Left lower leg: No edema.  Skin:    General: Skin is warm  and dry.  Neurological:     Mental Status: He is alert.  Psychiatric:        Behavior: Behavior normal.    ED Treatments / Results  Labs (all labs ordered are listed, but only abnormal results are displayed) Labs Reviewed  SARS CORONAVIRUS 2 (HOSPITAL ORDER, PERFORMED IN [facility]) - Abnormal; Notable for the following components:      Result Value   SARS Coronavirus 2 POSITIVE (*)    All other components within normal limits  COMPREHENSIVE METABOLIC PANEL - Abnormal; Notable for the following components:   Sodium 133 (*)    Glucose, Bld 116 (*)    BUN 42 (*)    Creatinine, Ser 3.37 (*)    Calcium 8.7 (*)    GFR calc non Af Amer 19 (*)    GFR calc Af Amer 22 (*)    All other components within normal limits  URINALYSIS, ROUTINE W REFLEX MICROSCOPIC - Abnormal; Notable for the following components:   APPearance HAZY (*)    Hgb urine dipstick SMALL (*)    Protein, ur 30 (*)    Bacteria, UA RARE (*)    All other components within  normal limits  CBC WITH DIFFERENTIAL/PLATELET    EKG None  Radiology Dg Chest Portable 1 View  Result Date: 09/22/2018 CLINICAL DATA:  Cough and fever EXAM: PORTABLE CHEST 1 VIEW COMPARISON:  02/22/2016 FINDINGS: Cardiac shadow is within normal limits. Elevation the right hemidiaphragm is noted increased when compared with the prior exam. No focal infiltrate or sizable effusion is seen. No bony abnormality is noted. IMPRESSION: No acute abnormality noted. Electronically Signed   By: Alcide CleverMark  Lukens M.D.   On: 09/22/2018 23:43    Procedures Procedures (including critical care time)  Medications Ordered in ED Medications  acetaminophen (TYLENOL) tablet 1,000 mg (1,000 mg Oral Given 09/22/18 2357)  sodium chloride 0.9 % bolus 500 mL (500 mLs Intravenous New Bag/Given 09/23/18 0418)     Initial Impression / Assessment and Plan / ED Course  I have reviewed the triage vital signs and the nursing notes.  Pertinent labs & imaging results that were available during my care of the patient were reviewed by me and considered in my medical decision making (see chart for details).        63 year old male with a history of diabetes mellitus, gastrointestinal bleeding, cervical spondylosis with radiculopathy, hypertension presenting with polyuria, polydipsia, fatigue, chills, lightheadedness, nonproductive cough that developed 4 days ago.  Febrile to 100.5 on arrival.  He has no tachypnea, hypoxia and is normotensive.  No tachycardia.  Labs are notable for creatinine of 3.37, up from 1.48 in February.  UA with small hemoglobinuria and mild proteinuria, but not concerning for infection.  Fever and fatigue is most likely secondary to COVID-19 as the patient tested positive.    The patient is not on any nephrotoxic agents to account for acute renal failure.  He was given a small 500 cc fluid bolus in the ER.  Consult to the hospitalist team and Dr. Thomes DinningAdefeso will admit for acute renal failure. The  patient appears reasonably stabilized for admission considering the current resources, flow, and capabilities available in the ED at this time, and I doubt any other [facility] requiring further screening and/or treatment in the ED prior to admission.  Final Clinical Impressions(s) / ED Diagnoses   Final diagnoses:  COVID-19  Acute renal failure, unspecified acute renal failure type [facility](HCC)    ED Discharge Orders  None       Joanne Gavel, PA-C 09/23/18 0536    Ezequiel Essex, MD 09/23/18 [phone]

## 2018-09-22 NOTE — ED Notes (Signed)
While typing the other note, patient ambulated back into room. He stated he needed to get his jacket. Informed patient he had to stay in the room since he was possible COVID.

## 2018-09-23 ENCOUNTER — Encounter (HOSPITAL_COMMUNITY): Payer: Self-pay | Admitting: Internal Medicine

## 2018-09-23 DIAGNOSIS — N170 Acute kidney failure with tubular necrosis: Secondary | ICD-10-CM | POA: Diagnosis not present

## 2018-09-23 DIAGNOSIS — N183 Chronic kidney disease, stage 3 unspecified: Secondary | ICD-10-CM | POA: Diagnosis present

## 2018-09-23 DIAGNOSIS — U071 COVID-19: Secondary | ICD-10-CM | POA: Diagnosis not present

## 2018-09-23 DIAGNOSIS — N179 Acute kidney failure, unspecified: Secondary | ICD-10-CM

## 2018-09-23 LAB — CBC WITH DIFFERENTIAL/PLATELET
Abs Immature Granulocytes: 0.02 10*3/uL (ref 0.00–0.07)
Basophils Absolute: 0 10*3/uL (ref 0.0–0.1)
Basophils Relative: 0 %
Eosinophils Absolute: 0 10*3/uL (ref 0.0–0.5)
Eosinophils Relative: 0 %
HCT: 40.7 % (ref 39.0–52.0)
Hemoglobin: 13.2 g/dL (ref 13.0–17.0)
Immature Granulocytes: 0 %
Lymphocytes Relative: 19 %
Lymphs Abs: 1.6 10*3/uL (ref 0.7–4.0)
MCH: 30.4 pg (ref 26.0–34.0)
MCHC: 32.4 g/dL (ref 30.0–36.0)
MCV: 93.8 fL (ref 80.0–100.0)
Monocytes Absolute: 0.9 10*3/uL (ref 0.1–1.0)
Monocytes Relative: 11 %
Neutro Abs: 5.9 10*3/uL (ref 1.7–7.7)
Neutrophils Relative %: 70 %
Platelets: 224 10*3/uL (ref 150–400)
RBC: 4.34 MIL/uL (ref 4.22–5.81)
RDW: 14.4 % (ref 11.5–15.5)
WBC: 8.4 10*3/uL (ref 4.0–10.5)
nRBC: 0 % (ref 0.0–0.2)

## 2018-09-23 LAB — URINALYSIS, ROUTINE W REFLEX MICROSCOPIC
Bilirubin Urine: NEGATIVE
Glucose, UA: NEGATIVE mg/dL
Ketones, ur: NEGATIVE mg/dL
Leukocytes,Ua: NEGATIVE
Nitrite: NEGATIVE
Protein, ur: 30 mg/dL — AB
Specific Gravity, Urine: 1.015 (ref 1.005–1.030)
pH: 5 (ref 5.0–8.0)

## 2018-09-23 LAB — COMPREHENSIVE METABOLIC PANEL
ALT: 21 U/L (ref 0–44)
AST: 34 U/L (ref 15–41)
Albumin: 3.9 g/dL (ref 3.5–5.0)
Alkaline Phosphatase: 53 U/L (ref 38–126)
Anion gap: 12 (ref 5–15)
BUN: 42 mg/dL — ABNORMAL HIGH (ref 8–23)
CO2: 22 mmol/L (ref 22–32)
Calcium: 8.7 mg/dL — ABNORMAL LOW (ref 8.9–10.3)
Chloride: 99 mmol/L (ref 98–111)
Creatinine, Ser: 3.37 mg/dL — ABNORMAL HIGH (ref 0.61–1.24)
GFR calc Af Amer: 22 mL/min — ABNORMAL LOW (ref >60)
GFR calc non Af Amer: 19 mL/min — ABNORMAL LOW (ref >60)
Glucose, Bld: 116 mg/dL — ABNORMAL HIGH (ref 70–99)
Potassium: 5 mmol/L (ref 3.5–5.1)
Sodium: 133 mmol/L — ABNORMAL LOW (ref 135–145)
Total Bilirubin: 0.5 mg/dL (ref 0.3–1.2)
Total Protein: 8.1 g/dL (ref 6.5–8.1)

## 2018-09-23 LAB — GLUCOSE, CAPILLARY: Glucose-Capillary: 98 mg/dL (ref 70–99)

## 2018-09-23 LAB — SARS CORONAVIRUS 2 BY RT PCR (HOSPITAL ORDER, PERFORMED IN ~~LOC~~ HOSPITAL LAB): SARS Coronavirus 2: POSITIVE — AB

## 2018-09-23 MED ORDER — ONDANSETRON HCL 4 MG/2ML IJ SOLN: 4.0000 mg | Freq: Four times a day (QID) | INTRAMUSCULAR | Status: DC | PRN

## 2018-09-23 MED ORDER — SODIUM CHLORIDE 0.9 % IV BOLUS
500.0000 mL | Freq: Once | INTRAVENOUS | Status: AC
  Administered 2018-09-23: 500 mL via INTRAVENOUS

## 2018-09-23 MED ORDER — ACETAMINOPHEN 325 MG PO TABS
650.0000 mg | ORAL_TABLET | Freq: Four times a day (QID) | ORAL | Status: DC | PRN
  Administered 2018-09-23 – 2018-09-24 (×2): 650 mg via ORAL
  Filled 2018-09-23 (×3): qty 2

## 2018-09-23 MED ORDER — ACETAMINOPHEN 650 MG RE SUPP
650.0000 mg | Freq: Four times a day (QID) | RECTAL | Status: DC | PRN
  Administered 2018-09-24: 650 mg via RECTAL

## 2018-09-23 MED ORDER — ONDANSETRON HCL 4 MG PO TABS: 4.0000 mg | ORAL_TABLET | Freq: Four times a day (QID) | ORAL | Status: DC | PRN

## 2018-09-23 MED ORDER — CLONIDINE HCL 0.1 MG PO TABS: 0.1000 mg | ORAL_TABLET | Freq: Three times a day (TID) | ORAL | Status: DC | PRN

## 2018-09-23 MED ORDER — HEPARIN SODIUM (PORCINE) 5000 UNIT/ML IJ SOLN
5000.0000 [IU] | Freq: Three times a day (TID) | INTRAMUSCULAR | Status: DC
  Administered 2018-09-23 – 2018-09-26 (×8): 5000 [IU] via SUBCUTANEOUS
  Filled 2018-09-23 (×8): qty 1

## 2018-09-23 MED ORDER — INSULIN ASPART 100 UNIT/ML ~~LOC~~ SOLN: 0.0000 [IU] | Freq: Every day | SUBCUTANEOUS | Status: DC

## 2018-09-23 MED ORDER — SODIUM CHLORIDE 0.9 % IV SOLN
INTRAVENOUS | Status: DC
  Administered 2018-09-23 – 2018-09-24 (×3): via INTRAVENOUS

## 2018-09-23 MED ORDER — GUAIFENESIN 100 MG/5ML PO SOLN
5.0000 mL | ORAL | Status: DC
  Administered 2018-09-23 – 2018-09-26 (×15): 100 mg via ORAL
  Filled 2018-09-23: qty 15
  Filled 2018-09-23: qty 10
  Filled 2018-09-23 (×3): qty 15
  Filled 2018-09-23 (×2): qty 10
  Filled 2018-09-23: qty 20
  Filled 2018-09-23: qty 10
  Filled 2018-09-23: qty 15
  Filled 2018-09-23: qty 10
  Filled 2018-09-23: qty 15
  Filled 2018-09-23: qty 10
  Filled 2018-09-23: qty 15
  Filled 2018-09-23: qty 10

## 2018-09-23 MED ORDER — INSULIN ASPART 100 UNIT/ML ~~LOC~~ SOLN
0.0000 [IU] | Freq: Three times a day (TID) | SUBCUTANEOUS | Status: DC
  Administered 2018-09-25: 09:00:00 1 [IU] via SUBCUTANEOUS
  Administered 2018-09-25 – 2018-09-26 (×3): 2 [IU] via SUBCUTANEOUS

## 2018-09-23 NOTE — ED Notes (Signed)
ED TO INPATIENT HANDOFF REPORT  Name/Age/Gender Andre Rodriguez 63 y.o. male  Code Status Code Status History    Date Active Date Inactive Code Status Order ID Comments User Context   01/28/2017 1349 01/29/2017 1452 Full Code [id number]  Ditty, Loura HaltBenjamin Jared, MD Inpatient   02/22/2016 1724 02/23/2016 1713 Full Code [id number]  Haydee SalterHobbs, Phillip M, MD Inpatient   Advance Care Planning Activity      Home/SNF/Other Home  Chief Complaint fatigue/lost of appetite  Level of Care/Admitting Diagnosis ED Disposition    ED Disposition Condition Comment   Admit  [facility] [[id number]]  Level of Care: Med-Surg [16]  Covid Evaluation: Confirmed COVID Positive  Diagnosis: Acute kidney injury (AKI) with acute tubular necrosis (ATN) [facility](HCC) [[id number]][[id number]]  Admitting Physician: Dorcas CarrowGHIMIRE, KUBER [[id number]]  Attending Physician: Dorcas CarrowGHIMIRE, KUBER [[id number]]  PT Class (Do Not Modify): Observation [104]  PT Acc Code (Do Not Modify): Observation [10022]       Medical History Past Medical History:  Diagnosis Date  . Complication of anesthesia    some blood pressure issues "dropping"? with back surgery in [place]  . Diabetes mellitus without complication (HCC)   . Hypertension     Allergies Allergies  Allergen Reactions  . Nsaids Other (See Comments)    GI BLEEDS   . Shellfish-Derived Products Nausea And Vomiting    Mussels  . Aspirin Other (See Comments)    GI BLEEDS  . Meloxicam Other (See Comments)    Stomach ulcer/bleeding   . Tramadol Itching    Severe itching    IV Location/Drains/Wounds Patient Lines/Drains/Airways Status   Active Line/Drains/Airways    Name:   Placement date:   Placement time:   Site:   Days:   Peripheral IV 09/23/18 Right Hand   09/23/18    0418    Hand   less than 1   Incision (Closed) 01/28/17 Neck Right   01/28/17    1036     603          Labs/Imaging Results for orders placed or performed during the hospital encounter of 09/22/18  (from the past 48 hour(s))  Urinalysis, Routine w reflex microscopic     Status: Abnormal   Collection Time: 09/22/18 11:19 PM  Result Value Ref Range   Color, Urine YELLOW YELLOW   APPearance HAZY (A) CLEAR   Specific Gravity, Urine 1.015 1.005 - 1.030   pH 5.0 5.0 - 8.0   Glucose, UA NEGATIVE NEGATIVE mg/dL   Hgb urine dipstick SMALL (A) NEGATIVE   Bilirubin Urine NEGATIVE NEGATIVE   Ketones, ur NEGATIVE NEGATIVE mg/dL   Protein, ur 30 (A) NEGATIVE mg/dL   Nitrite NEGATIVE NEGATIVE   Leukocytes,Ua NEGATIVE NEGATIVE   RBC / HPF 0-5 0 - 5 RBC/hpf   WBC, UA 0-5 0 - 5 WBC/hpf   Bacteria, UA RARE (A) NONE SEEN   Mucus PRESENT    Hyaline Casts, UA PRESENT    Amorphous Crystal PRESENT     Comment: Performed at [facility], [address] [place], [place] [address]  CBC with Differential     Status: None   Collection Time: 09/23/18 12:02 AM  Result Value Ref Range   WBC 8.4 4.0 - 10.5 K/uL   RBC 4.34 4.22 - 5.81 MIL/uL   Hemoglobin 13.2 13.0 - 17.0 g/dL   HCT 25.340.7 66.439.0 - 40.352.0 %   MCV 93.8 80.0 - 100.0 fL   MCH 30.4 26.0 - 34.0 pg   MCHC  32.4 30.0 - 36.0 g/dL   RDW 57.814.4 46.911.5 - 62.915.5 %   Platelets 224 150 - 400 K/uL   nRBC 0.0 0.0 - 0.2 %   Neutrophils Relative % 70 %   Neutro Abs 5.9 1.7 - 7.7 K/uL   Lymphocytes Relative 19 %   Lymphs Abs 1.6 0.7 - 4.0 K/uL   Monocytes Relative 11 %   Monocytes Absolute 0.9 0.1 - 1.0 K/uL   Eosinophils Relative 0 %   Eosinophils Absolute 0.0 0.0 - 0.5 K/uL   Basophils Relative 0 %   Basophils Absolute 0.0 0.0 - 0.1 K/uL   Immature Granulocytes 0 %   Abs Immature Granulocytes 0.02 0.00 - 0.07 K/uL    Comment: Performed at [facility], [address] [place], [place] [address]  Comprehensive metabolic panel     Status: Abnormal   Collection Time: 09/23/18 12:02 AM  Result Value Ref Range   Sodium 133 (L) 135 - 145 mmol/L   Potassium 5.0 3.5 - 5.1 mmol/L   Chloride 99 98 - 111 mmol/L   CO2 22 22 -  32 mmol/L   Glucose, Bld 116 (H) 70 - 99 mg/dL   BUN 42 (H) 8 - 23 mg/dL   Creatinine, Ser 3.243.37 (H) 0.61 - 1.24 mg/dL   Calcium 8.7 (L) 8.9 - 10.3 mg/dL   Total Protein 8.1 6.5 - 8.1 g/dL   Albumin 3.9 3.5 - 5.0 g/dL   AST 34 15 - 41 U/L   ALT 21 0 - 44 U/L   Alkaline Phosphatase 53 38 - 126 U/L   Total Bilirubin 0.5 0.3 - 1.2 mg/dL   GFR calc non Af Amer 19 (L) >60 mL/min   GFR calc Af Amer 22 (L) >60 mL/min   Anion gap 12 5 - 15    Comment: Performed at [facility], [address] [place], [place] [address]  SARS Coronavirus 2 (CEPHEID - Performed in [facility] hospital lab), Hosp Order     Status: Abnormal   Collection Time: 09/23/18 12:59 AM   Specimen: Nasopharyngeal Swab  Result Value Ref Range   SARS Coronavirus 2 POSITIVE (A) NEGATIVE    Comment: RESULT CALLED TO, READ BACK BY AND VERIFIED WITH: WICKER,K @ 0424 ON [id number] BY POTEAT,S Performed at [facility], [address] [place], [place] [address]    Dg Chest Portable 1 View  Result Date: 09/22/2018 CLINICAL DATA:  Cough and fever EXAM: PORTABLE CHEST 1 VIEW COMPARISON:  02/22/2016 FINDINGS: Cardiac shadow is within normal limits. Elevation the right hemidiaphragm is noted increased when compared with the prior exam. No focal infiltrate or sizable effusion is seen. No bony abnormality is noted. IMPRESSION: No acute abnormality noted. Electronically Signed   By: Alcide CleverMark  Lukens M.D.   On: 09/22/2018 23:43    Pending Labs [employer] Labs (From admission, onward)    Start     Ordered   Signed and Held  HIV antibody (Routine Testing)  Once,   R     Signed and Held   Signed and Held  Basic metabolic panel  Tomorrow morning,   R     Signed and Held   Signed and Held  CBC  Tomorrow morning,   R     Signed and Held          Vitals/Pain Today's Vitals   09/23/18 1300 09/23/18 1400 09/23/18 1430 09/23/18 1620  BP: (!) 135/59 (!) 128/58 (!) 174/94 (!) 158/78  Pulse: 81  88 82   Resp: 16  16 16   Temp:      TempSrc:      SpO2: 96%  94% 95%  Weight:      Height:      PainSc:        Isolation Precautions Airborne and Contact precautions  Medications Medications  0.9 %  sodium chloride infusion ( Intravenous Stopped 09/23/18 1620)  acetaminophen (TYLENOL) tablet 1,000 mg (1,000 mg Oral Given 09/22/18 2357)  sodium chloride 0.9 % bolus 500 mL (0 mLs Intravenous Stopped 09/23/18 0544)    Mobility walks

## 2018-09-23 NOTE — Plan of Care (Signed)
progressing 

## 2018-09-23 NOTE — ED Notes (Signed)
Date and time results received: 09/23/18 0423 (use smartphrase ".now" to insert current time)  Test: COVID Critical Value: positive  Name of Provider Notified: Mia, PA  Orders Received? Or Actions Taken?: Orders Received - See Orders for details

## 2018-09-23 NOTE — ED Notes (Signed)
Rpt given to [employer]

## 2018-09-23 NOTE — H&P (Signed)
History and Physical    Andre MulderJohn Rodriguez WUJ:[id number] DOB: 05/18/1955 DOA: 09/22/2018  PCP: System, Pcp Not In  Patient coming from: home   I have personally briefly reviewed patient's old medical records available.   Chief Complaint: Body ache, lightheadedness and not feeling well.  HPI: Andre MulderJohn Rodriguez is a 63 y.o. male with medical history significant of hypertension on lisinopril, type 2 diabetes on metformin presented to the emergency room with body ache, myalgia, weakness and fatigue for about 4 days and symptoms worse for 2 days.  Patient also has a nonproductive cough.  He had some chills at home.  He did not measure his temperature at home.  He has multiple coworkers that are positive for COVID-19.  Patient has poor appetite. No sputum production.  Denies any shortness of breath.  Increased urinary frequency and polyuria polydipsia.  Fatigue and weakness. ED Course: Febrile with temperature of 100.5.  Blood pressures are adequate.  Patient on room air 95%.  Sodium 133.  BUN/creatinine 42/3.37, recent creatinine 1.48 few months ago.  Chest x-ray is clear.  COVID-19 positive.  Urine with no evidence of infection. Patient was only given 500 mL fluid in the ER.  Review of Systems: all systems are reviewed and pertinent positive as per HPI otherwise rest are negative.    Past Medical History:  Diagnosis Date  . Complication of anesthesia    some blood pressure issues "dropping"? with back surgery in [place]  . Diabetes mellitus without complication (HCC)   . Hypertension     Past Surgical History:  Procedure Laterality Date  . ANTERIOR CERVICAL DECOMP/DISCECTOMY FUSION N/A 01/28/2017   Procedure: Anterior Cervical Discectomy with Fusion and Plate Fixation Cervical Four through Seven;  Surgeon: Ditty, Loura HaltBenjamin Jared, MD;  [facility];  Service: Neurosurgery;  Laterality: N/A;   Anterior Cervical Discectomy with Fusion and Plate Fixation Cervical Four through Seven  . BACK SURGERY    .  ELBOW SURGERY     fracture surgery  . ESOPHAGOGASTRODUODENOSCOPY (EGD) WITH PROPOFOL N/A 02/22/2016   Procedure: ESOPHAGOGASTRODUODENOSCOPY (EGD) WITH PROPOFOL;  Surgeon: Bernette Redbirdobert Buccini, MD;  Location: [facility] ENDOSCOPY;  Service: Endoscopy;  Laterality: N/A;  . JOINT REPLACEMENT    . KNEE SURGERY Left 1976     reports that he quit smoking about 22 years ago. He has never used smokeless tobacco. He reports current alcohol use. He reports that he does not use drugs.  Allergies  Allergen Reactions  . Nsaids Other (See Comments)    GI BLEEDS   . Shellfish-Derived Products Nausea And Vomiting    Mussels  . Aspirin Other (See Comments)    GI BLEEDS  . Meloxicam Other (See Comments)    Stomach ulcer/bleeding   . Tramadol Itching    Severe itching    History reviewed. No pertinent family history.   Prior to Admission medications   Medication Sig Start Date End Date Taking? Authorizing Provider  lisinopril (PRINIVIL,ZESTRIL) 20 MG tablet Take 20 mg daily by mouth.   Yes [provider]  metFORMIN (GLUCOPHAGE-XR) 500 MG 24 hr tablet Take 1,000 mg by mouth 2 (two) times daily with a meal. 12/03/17  Yes [provider]  gabapentin (NEURONTIN) 400 MG capsule Take 1 capsule (400 mg total) by mouth 3 (three) times daily. Patient not taking: Reported on 04/06/2018 01/29/17 09/23/18  Ditty, Loura HaltBenjamin Jared, MD  omeprazole (PRILOSEC) 20 MG capsule Take 1 capsule (20 mg total) by mouth 2 (two) times daily before a meal. Patient not taking:  Reported on 04/06/2018 03/01/17 09/23/18  Fayrene Helperran, [place], PA-C    Physical Exam: Vitals:   09/23/18 0600 09/23/18 0630 09/23/18 0700 09/23/18 0821  BP: (!) 150/99 (!) 169/105 (!) 169/105 (!) 108/48  Pulse: 87 87 87 82  Resp:   16 16  Temp:      TempSrc:      SpO2: 95% 93% 93% 97%  Weight:      Height:        Constitutional: NAD, calm, comfortable Vitals:   09/23/18 0600 09/23/18 0630 09/23/18 0700 09/23/18 0821  BP: (!) 150/99 (!) 169/105 (!)  169/105 (!) 108/48  Pulse: 87 87 87 82  Resp:   16 16  Temp:      TempSrc:      SpO2: 95% 93% 93% 97%  Weight:      Height:       Eyes: PERRL, lids and conjunctivae normal ENMT: Mucous membranes are dry. Posterior pharynx clear of any exudate or lesions.Normal dentition.  Neck: normal, supple, no masses, no thyromegaly Respiratory: clear to auscultation bilaterally, no wheezing, no crackles. Normal respiratory effort. No accessory muscle use.  Cardiovascular: Regular rate and rhythm, no murmurs / rubs / gallops. No extremity edema. 2+ pedal pulses. No carotid bruits.  Abdomen: no tenderness, no masses palpated. No hepatosplenomegaly. Bowel sounds positive.  Musculoskeletal: no clubbing / cyanosis. No joint deformity upper and lower extremities. Good ROM, no contractures. Normal muscle tone.  Skin: no rashes, lesions, ulcers. No induration Neurologic: CN 2-12 grossly intact. Sensation intact, DTR normal. Strength 5/5 in all 4.  Psychiatric: Normal judgment and insight. Alert and oriented x 3. Normal mood.     Labs on Admission: I have personally reviewed following labs and imaging studies  CBC: Recent Labs  Lab 09/23/18 0002  WBC 8.4  NEUTROABS 5.9  HGB 13.2  HCT 40.7  MCV 93.8  PLT 224   Basic Metabolic Panel: Recent Labs  Lab 09/23/18 0002  NA 133*  K 5.0  CL 99  CO2 22  GLUCOSE 116*  BUN 42*  CREATININE 3.37*  CALCIUM 8.7*   GFR: Estimated Creatinine Clearance: 29.2 mL/min (A) (by C-G formula based on SCr of 3.37 mg/dL (H)). Liver Function Tests: Recent Labs  Lab 09/23/18 0002  AST 34  ALT 21  ALKPHOS 53  BILITOT 0.5  PROT 8.1  ALBUMIN 3.9   No results for input(s): LIPASE, AMYLASE in the last 168 hours. No results for input(s): AMMONIA in the last 168 hours. Coagulation Profile: No results for input(s): INR, PROTIME in the last 168 hours. Cardiac Enzymes: No results for input(s): CKTOTAL, CKMB, CKMBINDEX, TROPONINI in the last 168 hours. BNP (last  3 results) No results for input(s): PROBNP in the last 8760 hours. HbA1C: No results for input(s): HGBA1C in the last 72 hours. CBG: No results for input(s): GLUCAP in the last 168 hours. Lipid Profile: No results for input(s): CHOL, HDL, LDLCALC, TRIG, CHOLHDL, LDLDIRECT in the last 72 hours. Thyroid Function Tests: No results for input(s): TSH, T4TOTAL, FREET4, T3FREE, THYROIDAB in the last 72 hours. Anemia Panel: No results for input(s): VITAMINB12, FOLATE, FERRITIN, TIBC, IRON, RETICCTPCT in the last 72 hours. Urine analysis:    Component Value Date/Time   COLORURINE YELLOW 09/22/2018 2319   APPEARANCEUR HAZY (A) 09/22/2018 2319   LABSPEC 1.015 09/22/2018 2319   PHURINE 5.0 09/22/2018 2319   GLUCOSEU NEGATIVE 09/22/2018 2319   HGBUR SMALL (A) 09/22/2018 2319   BILIRUBINUR NEGATIVE 09/22/2018 2319   KETONESUR NEGATIVE  09/22/2018 2319   PROTEINUR 30 (A) 09/22/2018 2319   UROBILINOGEN 0.2 11/07/2013 2030   NITRITE NEGATIVE 09/22/2018 2319   LEUKOCYTESUR NEGATIVE 09/22/2018 2319    Radiological Exams on Admission: Dg Chest Portable 1 View  Result Date: 09/22/2018 CLINICAL DATA:  Cough and fever EXAM: PORTABLE CHEST 1 VIEW COMPARISON:  02/22/2016 FINDINGS: Cardiac shadow is within normal limits. Elevation the right hemidiaphragm is noted increased when compared with the prior exam. No focal infiltrate or sizable effusion is seen. No bony abnormality is noted. IMPRESSION: No acute abnormality noted. Electronically Signed   By: Inez Catalina M.D.   On: 09/22/2018 23:43    EKG: Independently reviewed.  Normal sinus rhythm.  No acute ST-T wave changes.  Assessment/Plan Active Problems:   DM (diabetes mellitus) ([place])   HTN (hypertension)   AKI (acute kidney injury) ([place])   COVID-19 virus infection   CKD (chronic kidney disease), stage III (HCC)   Acute kidney injury (AKI) with acute tubular necrosis (ATN) ([place])     1.  Acute kidney injury with history of chronic kidney  disease stage III: Suspect prerenal failure with poor appetite and poor oral intake aggravated by ongoing use of ACE inhibitors and metformin.  Patient has adequate urine output. Agree with monitoring in the hospital.  Continue low-dose maintenance IV fluids.  Intake and output monitoring.  We will recheck levels in the morning.  Potassium level is normal. Patient was only given 500 mL of fluid in the ER, however he does not have any pulmonary symptoms, with acute kidney injury, will keep patient on maintenance IV fluid.  2.  CKD stage III due to diabetes: With baseline creatinine about 1.48 on medical records.  3.  COVID-19 virus infection: Patient does not have any pulmonary symptoms.  He is more than 95% saturating on room air.  Chest x-ray is clear.  Will monitor.  No indication to start on any treatment at this time.   4.  Hypertension: Blood pressures are stable.  Holding ACE inhibitor's.  Once renal functions improved, will resume.  If blood pressure is elevated, will start on amlodipine.  5.  Type 2 diabetes: Fairly controlled as per patient.  On metformin at home.  With acute kidney injury and poor appetite, will discontinue metformin.  Keep on sliding scale insulin while in the hospital.  If renal functions normalized and his appetite improves, he can go back on metformin on discharge.    DVT prophylaxis: Heparin subcu Code Status: Full code Family Communication: None Disposition Plan: Home tomorrow Consults called: None Admission status: Observation.   Barb Merino MD [facility] Pager [phone]  If 7PM-7AM, please contact night-coverage [email or web address] Password [facility]  09/23/2018, 9:28 AM

## 2018-09-23 NOTE — Progress Notes (Signed)
Patient's wife, Danae Chen notified of patient's admission. Given updates. Transferred to patient's room.

## 2018-09-24 DIAGNOSIS — E871 Hypo-osmolality and hyponatremia: Secondary | ICD-10-CM | POA: Diagnosis present

## 2018-09-24 DIAGNOSIS — Z7984 Long term (current) use of oral hypoglycemic drugs: Secondary | ICD-10-CM | POA: Diagnosis not present

## 2018-09-24 DIAGNOSIS — N170 Acute kidney failure with tubular necrosis: Secondary | ICD-10-CM | POA: Diagnosis present

## 2018-09-24 DIAGNOSIS — N179 Acute kidney failure, unspecified: Secondary | ICD-10-CM | POA: Diagnosis present

## 2018-09-24 DIAGNOSIS — E875 Hyperkalemia: Secondary | ICD-10-CM | POA: Diagnosis present

## 2018-09-24 DIAGNOSIS — N183 Chronic kidney disease, stage 3 (moderate): Secondary | ICD-10-CM

## 2018-09-24 DIAGNOSIS — E119 Type 2 diabetes mellitus without complications: Secondary | ICD-10-CM

## 2018-09-24 DIAGNOSIS — U071 COVID-19: Secondary | ICD-10-CM | POA: Diagnosis present

## 2018-09-24 DIAGNOSIS — Z87891 Personal history of nicotine dependence: Secondary | ICD-10-CM | POA: Diagnosis not present

## 2018-09-24 DIAGNOSIS — E860 Dehydration: Secondary | ICD-10-CM | POA: Diagnosis present

## 2018-09-24 DIAGNOSIS — Z981 Arthrodesis status: Secondary | ICD-10-CM | POA: Diagnosis not present

## 2018-09-24 DIAGNOSIS — T464X5A Adverse effect of angiotensin-converting-enzyme inhibitors, initial encounter: Secondary | ICD-10-CM | POA: Diagnosis present

## 2018-09-24 DIAGNOSIS — E1122 Type 2 diabetes mellitus with diabetic chronic kidney disease: Secondary | ICD-10-CM | POA: Diagnosis present

## 2018-09-24 DIAGNOSIS — I129 Hypertensive chronic kidney disease with stage 1 through stage 4 chronic kidney disease, or unspecified chronic kidney disease: Secondary | ICD-10-CM | POA: Diagnosis present

## 2018-09-24 LAB — CBC
HCT: 42 % (ref 39.0–52.0)
Hemoglobin: 13.6 g/dL (ref 13.0–17.0)
MCH: 30.1 pg (ref 26.0–34.0)
MCHC: 32.4 g/dL (ref 30.0–36.0)
MCV: 92.9 fL (ref 80.0–100.0)
Platelets: 207 10*3/uL (ref 150–400)
RBC: 4.52 MIL/uL (ref 4.22–5.81)
RDW: 14.4 % (ref 11.5–15.5)
WBC: 7.6 10*3/uL (ref 4.0–10.5)
nRBC: 0 % (ref 0.0–0.2)

## 2018-09-24 LAB — LACTATE DEHYDROGENASE: LDH: 230 U/L — ABNORMAL HIGH (ref 98–192)

## 2018-09-24 LAB — GLUCOSE, CAPILLARY
Glucose-Capillary: 95 mg/dL (ref 70–99)
Glucose-Capillary: 113 mg/dL — ABNORMAL HIGH (ref 70–99)
Glucose-Capillary: 107 mg/dL — ABNORMAL HIGH (ref 70–99)
Glucose-Capillary: 109 mg/dL — ABNORMAL HIGH (ref 70–99)

## 2018-09-24 LAB — D-DIMER, QUANTITATIVE: D-Dimer, Quant: 0.58 ug/mL-FEU — ABNORMAL HIGH (ref 0.00–0.50)

## 2018-09-24 LAB — ABO/RH: ABO/RH(D): O POS

## 2018-09-24 LAB — BASIC METABOLIC PANEL
Anion gap: 13 (ref 5–15)
BUN: 35 mg/dL — ABNORMAL HIGH (ref 8–23)
CO2: 18 mmol/L — ABNORMAL LOW (ref 22–32)
Calcium: 8.7 mg/dL — ABNORMAL LOW (ref 8.9–10.3)
Chloride: 102 mmol/L (ref 98–111)
Creatinine, Ser: 2.49 mg/dL — ABNORMAL HIGH (ref 0.61–1.24)
GFR calc Af Amer: 31 mL/min — ABNORMAL LOW (ref >60)
GFR calc non Af Amer: 27 mL/min — ABNORMAL LOW (ref >60)
Glucose, Bld: 105 mg/dL — ABNORMAL HIGH (ref 70–99)
Potassium: 5.1 mmol/L (ref 3.5–5.1)
Sodium: 133 mmol/L — ABNORMAL LOW (ref 135–145)

## 2018-09-24 LAB — HIV ANTIBODY (ROUTINE TESTING W REFLEX): HIV Screen 4th Generation wRfx: NONREACTIVE

## 2018-09-24 LAB — FERRITIN: Ferritin: 577 ng/mL — ABNORMAL HIGH (ref 24–336)

## 2018-09-24 LAB — C-REACTIVE PROTEIN: CRP: 5 mg/dL — ABNORMAL HIGH (ref <1.0)

## 2018-09-24 LAB — PROCALCITONIN: Procalcitonin: <0.1 ng/mL

## 2018-09-24 LAB — SEDIMENTATION RATE: Sed Rate: 39 mm/hr — ABNORMAL HIGH (ref 0–16)

## 2018-09-24 LAB — BRAIN NATRIURETIC PEPTIDE: B Natriuretic Peptide: 11.1 pg/mL (ref 0.0–100.0)

## 2018-09-24 MED ORDER — SODIUM CHLORIDE 0.9 % IV SOLN
200.0000 mg | Freq: Once | INTRAVENOUS | Status: AC
  Administered 2018-09-25: 200 mg via INTRAVENOUS
  Filled 2018-09-24: qty 40

## 2018-09-24 MED ORDER — SODIUM CHLORIDE 0.9 % IV SOLN: 100.0000 mg | INTRAVENOUS | Status: DC

## 2018-09-24 MED ORDER — SODIUM CHLORIDE 0.9 % IV SOLN: INTRAVENOUS | Status: DC

## 2018-09-24 MED ORDER — DEXAMETHASONE SODIUM PHOSPHATE 10 MG/ML IJ SOLN
8.0000 mg | Freq: Every day | INTRAMUSCULAR | Status: DC
  Administered 2018-09-24: 8 mg via INTRAVENOUS
  Filled 2018-09-24: qty 0.8

## 2018-09-24 NOTE — Progress Notes (Signed)
PROGRESS NOTE  Andre Rodriguez [id number]:[id number] DOB: Jun 30, 1955 DOA: 09/22/2018 PCP: System, Pcp Not In  HPI/Recap of past 24 hours:  Last fever of 101.1 at 5am , he denies cough, no chest pain, no sob at rest currently  He is talking on the phone when I entered his room   Assessment/Plan: Active Problems:   DM (diabetes mellitus) ([place])   HTN (hypertension)   AKI (acute kidney injury) ([place])   COVID-19 virus infection   CKD (chronic kidney disease), stage III ([place])   Acute kidney injury (AKI) with acute tubular necrosis (ATN) ([place])  COVID19 infection With fever cxr on presentation no acute findings Will order inflammatory markers, consider start remdesivir and steroids if markers are elevated  AKI on CKDII ua with rare bacteremia but negative leuks , negative nitrite, he denies urinary symptom Cr improving, but not back to baseline, cut down ivf rate from 100cc /hr to 50cc/hr and keep on ivf for another 10hrs, judicious with ivf in the setting of covid 19infection Continue hold lisinopril and metformin   encourage oral intake  Hyponatremia, mild  Likely from dehydration He was started on ns   noninsulin dependent dm2 Well controlled prior to hospitalization, expect elevated blood glucose if started on steroids On ssi  HTN: bp stable off lisinopril, monitor    Code Status: full  Family Communication: patient   Disposition Plan: transfer to [facility]   Consultants:  none  Procedures:  none  Antibiotics:  none   Objective: BP (!) 115/56 (BP Location: Left Arm)   Pulse 91   Temp 98.3 F (36.8 C) (Oral)   Resp 20   Ht 5\' 11"  (1.803 m)   Wt 107.2 kg   SpO2 95%   BMI 32.96 kg/m   Intake/Output Summary (Last 24 hours) at 09/24/2018 0958 Last data filed at 09/24/2018 0700 Gross per 24 hour  Intake 1105 ml  Output 450 ml  Net 655 ml   Filed Weights   09/22/18 1959 09/24/18 0509  Weight: 111.1 kg 107.2 kg    Exam: Patient is examined daily including  today on 09/24/2018, exams remain the same as of yesterday except that has changed    General:  NAD  Cardiovascular: RRR  Respiratory: CTABL  Abdomen: Soft/ND/NT, positive BS  Musculoskeletal: No Edema  Neuro: alert, oriented   Data Reviewed: Basic Metabolic Panel: Recent Labs  Lab 09/23/18 0002 09/24/18 0458  NA 133* 133*  K 5.0 5.1  CL 99 102  CO2 22 18*  GLUCOSE 116* 105*  BUN 42* 35*  CREATININE 3.37* 2.49*  CALCIUM 8.7* 8.7*   Liver Function Tests: Recent Labs  Lab 09/23/18 0002  AST 34  ALT 21  ALKPHOS 53  BILITOT 0.5  PROT 8.1  ALBUMIN 3.9   No results for input(s): LIPASE, AMYLASE in the last 168 hours. No results for input(s): AMMONIA in the last 168 hours. CBC: Recent Labs  Lab 09/23/18 0002 09/24/18 0458  WBC 8.4 7.6  NEUTROABS 5.9  --   HGB 13.2 13.6  HCT 40.7 42.0  MCV 93.8 92.9  PLT 224 207   Cardiac Enzymes:   No results for input(s): CKTOTAL, CKMB, CKMBINDEX, TROPONINI in the last 168 hours. BNP (last 3 results) No results for input(s): BNP in the last 8760 hours.  ProBNP (last 3 results) No results for input(s): PROBNP in the last 8760 hours.  CBG: Recent Labs  Lab 09/23/18 2037 09/24/18 0914  GLUCAP 98 95    Recent Results (from  the past 240 hour(s))  SARS Coronavirus 2 (CEPHEID - Performed in [facility] hospital lab), Hosp Order     Status: Abnormal   Collection Time: 09/23/18 12:59 AM   Specimen: Nasopharyngeal Swab  Result Value Ref Range Status   SARS Coronavirus 2 POSITIVE (A) NEGATIVE Final    Comment: RESULT CALLED TO, READ BACK BY AND VERIFIED WITH: WICKER,K @ 0424 ON [id number] BY POTEAT,S Performed at [facility], [address] [place], [place] [address]      Studies: No results found.  Scheduled Meds: . guaiFENesin  5 mL Oral Q4H  . heparin  5,000 Units Subcutaneous Q8H  . insulin aspart  0-5 Units Subcutaneous QHS  . insulin aspart  0-9 Units Subcutaneous TID WC    Continuous  Infusions: . sodium chloride 100 mL/hr at 09/24/18 0057     Time spent: 35 mins I have personally reviewed and interpreted on  09/24/2018 daily labs,  imagings as discussed above under date review session and assessment and plans.  I reviewed all nursing notes, pharmacy notes, vitals, pertinent old records  I have discussed plan of care as described above with RN , patient on 09/24/2018   Albertine GratesFang Beckie Viscardi MD, PhD  [facility] Pager [phone]. If 7PM-7AM, please contact night-coverage at [email or web address], password [email or web address] 09/24/2018, 9:58 AM  LOS: 0 days

## 2018-09-24 NOTE — Progress Notes (Signed)
Pharmacy Brief Note  O:  ALT: 21 (7/21) CXR: negative (7/21) SpO2: 94% on RA  A/P:  Patient does not technically meet criteria for remdesevir therapy. Ordering MD was not available (after hours). Paged Dr. Bridgett Larsson at [facility] who wanted to leave the decision up to providers at [facility]. Paged Dr. Maudie Mercury at [facility] who wanted to proceed with remdesivir. Will initiate remdesivir 200 mg iv once followed by 100 mg iv daily x 4 days. Will f/u ALT.   Napoleon Form, [facility] 09/24/18 10:09 PM

## 2018-09-25 DIAGNOSIS — I10 Essential (primary) hypertension: Secondary | ICD-10-CM

## 2018-09-25 LAB — COMPREHENSIVE METABOLIC PANEL
ALT: 26 U/L (ref 0–44)
AST: 41 U/L (ref 15–41)
Albumin: 3.7 g/dL (ref 3.5–5.0)
Alkaline Phosphatase: 55 U/L (ref 38–126)
Anion gap: 9 (ref 5–15)
BUN: 35 mg/dL — ABNORMAL HIGH (ref 8–23)
CO2: 19 mmol/L — ABNORMAL LOW (ref 22–32)
Calcium: 8.8 mg/dL — ABNORMAL LOW (ref 8.9–10.3)
Chloride: 102 mmol/L (ref 98–111)
Creatinine, Ser: 2.2 mg/dL — ABNORMAL HIGH (ref 0.61–1.24)
GFR calc Af Amer: 36 mL/min — ABNORMAL LOW (ref >60)
GFR calc non Af Amer: 31 mL/min — ABNORMAL LOW (ref >60)
Glucose, Bld: 139 mg/dL — ABNORMAL HIGH (ref 70–99)
Potassium: 5.7 mmol/L — ABNORMAL HIGH (ref 3.5–5.1)
Sodium: 130 mmol/L — ABNORMAL LOW (ref 135–145)
Total Bilirubin: 0.6 mg/dL (ref 0.3–1.2)
Total Protein: 8 g/dL (ref 6.5–8.1)

## 2018-09-25 LAB — D-DIMER, QUANTITATIVE: D-Dimer, Quant: 0.69 ug/mL-FEU — ABNORMAL HIGH (ref 0.00–0.50)

## 2018-09-25 LAB — CBC
HCT: 41.3 % (ref 39.0–52.0)
Hemoglobin: 13.5 g/dL (ref 13.0–17.0)
MCH: 29.8 pg (ref 26.0–34.0)
MCHC: 32.7 g/dL (ref 30.0–36.0)
MCV: 91.2 fL (ref 80.0–100.0)
Platelets: 225 10*3/uL (ref 150–400)
RBC: 4.53 MIL/uL (ref 4.22–5.81)
RDW: 14 % (ref 11.5–15.5)
WBC: 4.5 10*3/uL (ref 4.0–10.5)
nRBC: 0 % (ref 0.0–0.2)

## 2018-09-25 LAB — FIBRINOGEN: Fibrinogen: 575 mg/dL — ABNORMAL HIGH (ref 210–475)

## 2018-09-25 LAB — DIFFERENTIAL
Abs Immature Granulocytes: 0.02 10*3/uL (ref 0.00–0.07)
Basophils Absolute: 0 10*3/uL (ref 0.0–0.1)
Basophils Relative: 0 %
Eosinophils Absolute: 0 10*3/uL (ref 0.0–0.5)
Eosinophils Relative: 0 %
Immature Granulocytes: 0 %
Lymphocytes Relative: 19 %
Lymphs Abs: 0.9 10*3/uL (ref 0.7–4.0)
Monocytes Absolute: 0.2 10*3/uL (ref 0.1–1.0)
Monocytes Relative: 5 %
Neutro Abs: 3.4 10*3/uL (ref 1.7–7.7)
Neutrophils Relative %: 76 %

## 2018-09-25 LAB — C-REACTIVE PROTEIN: CRP: 6.1 mg/dL — ABNORMAL HIGH (ref <1.0)

## 2018-09-25 LAB — INTERLEUKIN-6, PLASMA: Interleukin-6, Plasma: 30.5 pg/mL — ABNORMAL HIGH (ref 0.0–12.2)

## 2018-09-25 LAB — CK: Total CK: 957 U/L — ABNORMAL HIGH (ref 49–397)

## 2018-09-25 LAB — FERRITIN: Ferritin: 660 ng/mL — ABNORMAL HIGH (ref 24–336)

## 2018-09-25 LAB — GLUCOSE, CAPILLARY
Glucose-Capillary: 129 mg/dL — ABNORMAL HIGH (ref 70–99)
Glucose-Capillary: 152 mg/dL — ABNORMAL HIGH (ref 70–99)
Glucose-Capillary: 116 mg/dL — ABNORMAL HIGH (ref 70–99)
Glucose-Capillary: 154 mg/dL — ABNORMAL HIGH (ref 70–99)

## 2018-09-25 MED ORDER — DEXAMETHASONE 6 MG PO TABS
6.0000 mg | ORAL_TABLET | Freq: Every day | ORAL | Status: DC
  Administered 2018-09-25 – 2018-09-26 (×2): 6 mg via ORAL
  Filled 2018-09-25 (×2): qty 1

## 2018-09-25 MED ORDER — SODIUM POLYSTYRENE SULFONATE 15 GM/60ML PO SUSP
30.0000 g | Freq: Once | ORAL | Status: AC
  Administered 2018-09-25: 30 g via ORAL
  Filled 2018-09-25: qty 120

## 2018-09-25 MED ORDER — SODIUM CHLORIDE 0.9 % IV SOLN
INTRAVENOUS | Status: AC
  Administered 2018-09-25: 14:00:00 via INTRAVENOUS

## 2018-09-25 NOTE — Progress Notes (Signed)
Pt will no longer required remdesivir based on criteria per Dr Cruzita Lederer.   Onnie Boer, PharmD, BCIDP, AAHIVP, CPP Infectious Disease Pharmacist 09/25/2018 9:52 AM

## 2018-09-25 NOTE — Progress Notes (Signed)
PROGRESS NOTE  Andre MulderJohn Rodriguez UJW:[id number] DOB: 02/20/1956 DOA: 09/22/2018 PCP: System, Pcp Not In   LOS: 1 day   Brief Narrative / Interim history: 63 year old male with history of hypertension, type 2 diabetes mellitus who came to the emergency room with body aches, myalgia, weakness and fatigue for about 4-days.  He has multiple coworkers that are positive for COVID-19.  He also has been complaining of poor appetite and decreased p.o. intake.  He was found to be covered positive, was found to have acute kidney injury and was admitted to the hospital.  Patient denies any respiratory symptoms, cough, shortness of breath, chest congestion.  Subjective: Feels better this morning, wants to go home.  Denies any shortness of breath, denies any chest pain, denies any shortness of breath.  Assessment & Plan: Active Problems:   Diabetes mellitus type 2, noninsulin dependent (HCC)   HTN (hypertension)   AKI (acute kidney injury) (HCC)   COVID-19 virus infection   CKD (chronic kidney disease), stage III (HCC)   Acute renal failure (HCC)   Acute kidney injury with acute tubular necrosis (HCC)   Hyponatremia   Principal Problem Acute kidney injury on chronic kidney disease stage III -Likely in the setting of poor p.o. intake/dehydration, use of lisinopril -Creatinine back in 2017 ranging from 1.2-1.6 with no values since -Creatinine in the 3 range on admission, back down to 2.2 today -Continue IV fluids for 12 additional hours  Active problems: Covid-19 Viral Illness -Patient started on steroids due to increased inflammatory markers, continue -He was initially started Remdesivir, however patient has no hypoxia, chest x-ray does not show any signs of pneumonia, he had no respiratory symptoms, and does not meet [place] [facility] guidelines, will discontinue.  This was discussed with the patient.  COVID-19 Labs  Recent Labs    09/24/18 1205  DDIMER 0.58*  FERRITIN 577*  LDH 230*  CRP 5.0*     Lab Results  Component Value Date   SARSCOV2NAA POSITIVE (A) 09/23/2018   Type 2 diabetes mellitus -Hold metformin, place patient on sliding scale insulin, CBGs well-controlled  Hypertension -Hold home lisinopril, currently on PRN's, blood pressure seems to be well-controlled.  Discontinue lisinopril on discharge  Hyperkalemia -Likely in the setting of acute kidney injury, potassium 5.7 this morning, will give Kayexalate.  Discontinue lisinopril on discharge   Scheduled Meds: . dexamethasone (DECADRON) injection  8 mg Intravenous QHS  . guaiFENesin  5 mL Oral Q4H  . heparin  5,000 Units Subcutaneous Q8H  . insulin aspart  0-5 Units Subcutaneous QHS  . insulin aspart  0-9 Units Subcutaneous TID WC   Continuous Infusions: PRN Meds:.acetaminophen **OR** acetaminophen, cloNIDine, ondansetron **OR** ondansetron (ZOFRAN) IV  DVT prophylaxis: Heparin Code Status: Full code Family Communication: d/w patient  Disposition Plan: home in 24 h if stable  Consultants:   None   Procedures:   None   Antimicrobials:  None    Objective: Vitals:   09/25/18 0019 09/25/18 0208 09/25/18 0441 09/25/18 0749  BP:  121/73 130/82 117/61  Pulse:    74  Resp:    14  Temp: 98.7 F (37.1 C)  99.2 F (37.3 C) 98.9 F (37.2 C)  TempSrc: Oral  Oral Oral  SpO2:    92%  Weight:      Height:        Intake/Output Summary (Last 24 hours) at 09/25/2018 0953 Last data filed at 09/25/2018 0445 Gross per 24 hour  Intake 250 ml  Output 250 ml  Net 0 ml   Filed Weights   09/22/18 1959 09/24/18 0509  Weight: 111.1 kg 107.2 kg    Examination: Constitutional: NAD Eyes: PERRL, lids and conjunctivae normal ENMT: Mucous membranes are moist.  Respiratory: clear to auscultation bilaterally, no wheezing, no crackles. Normal respiratory effort. No accessory muscle use.  Cardiovascular: Regular rate and rhythm, no murmurs / rubs / gallops. No LE edema.  Abdomen: no tenderness. Bowel sounds  positive.  Musculoskeletal: no clubbing / cyanosis.  Skin: no rashes, lesions, ulcers. No induration Neurologic: CN 2-12 grossly intact. Strength 5/5 in all 4.  Psychiatric: Normal judgment and insight. Alert and oriented x 3.   Data Reviewed: I have independently reviewed following labs and imaging studies   CBC: Recent Labs  Lab 09/23/18 0002 09/24/18 0458 09/25/18 0800  WBC 8.4 7.6 4.5  NEUTROABS 5.9  --   --   HGB 13.2 13.6 13.5  HCT 40.7 42.0 41.3  MCV 93.8 92.9 91.2  PLT 224 207 225   Basic Metabolic Panel: Recent Labs  Lab 09/23/18 0002 09/24/18 0458 09/25/18 0800  NA 133* 133* 130*  K 5.0 5.1 5.7*  CL 99 102 102  CO2 22 18* 19*  GLUCOSE 116* 105* 139*  BUN 42* 35* 35*  CREATININE 3.37* 2.49* 2.20*  CALCIUM 8.7* 8.7* 8.8*   GFR: Estimated Creatinine Clearance: 43.9 mL/min (A) (by C-G formula based on SCr of 2.2 mg/dL (H)). Liver Function Tests: Recent Labs  Lab 09/23/18 0002 09/25/18 0800  AST 34 41  ALT 21 26  ALKPHOS 53 55  BILITOT 0.5 0.6  PROT 8.1 8.0  ALBUMIN 3.9 3.7   No results for input(s): LIPASE, AMYLASE in the last 168 hours. No results for input(s): AMMONIA in the last 168 hours. Coagulation Profile: No results for input(s): INR, PROTIME in the last 168 hours. Cardiac Enzymes: Recent Labs  Lab 09/25/18 0800  CKTOTAL 957*   BNP (last 3 results) No results for input(s): PROBNP in the last 8760 hours. HbA1C: No results for input(s): HGBA1C in the last 72 hours. CBG: Recent Labs  Lab 09/24/18 0914 09/24/18 1139 09/24/18 1715 09/24/18 2152 09/25/18 0748  GLUCAP 95 113* 107* 109* 129*   Lipid Profile: No results for input(s): CHOL, HDL, LDLCALC, TRIG, CHOLHDL, LDLDIRECT in the last 72 hours. Thyroid Function Tests: No results for input(s): TSH, T4TOTAL, FREET4, T3FREE, THYROIDAB in the last 72 hours. Anemia Panel: Recent Labs    09/24/18 1205  FERRITIN 577*   Urine analysis:    Component Value Date/Time   COLORURINE  YELLOW 09/22/2018 2319   APPEARANCEUR HAZY (A) 09/22/2018 2319   LABSPEC 1.015 09/22/2018 2319   PHURINE 5.0 09/22/2018 2319   GLUCOSEU NEGATIVE 09/22/2018 2319   HGBUR SMALL (A) 09/22/2018 2319   BILIRUBINUR NEGATIVE 09/22/2018 2319   KETONESUR NEGATIVE 09/22/2018 2319   PROTEINUR 30 (A) 09/22/2018 2319   UROBILINOGEN 0.2 11/07/2013 2030   NITRITE NEGATIVE 09/22/2018 2319   LEUKOCYTESUR NEGATIVE 09/22/2018 2319   Sepsis Labs: Invalid input(s): PROCALCITONIN, LACTICIDVEN  Recent Results (from the past 240 hour(s))  SARS Coronavirus 2 (CEPHEID - Performed in [facility] lab), Hosp Order     Status: Abnormal   Collection Time: 09/23/18 12:59 AM   Specimen: Nasopharyngeal Swab  Result Value Ref Range Status   SARS Coronavirus 2 POSITIVE (A) NEGATIVE Final    Comment: RESULT CALLED TO, READ BACK BY AND VERIFIED WITH: WICKER,K @ 0424 ON [id number] BY POTEAT,S Performed at [facility], [address] [place], [place] [address]   Culture, blood (routine x 2)     Status: None (Preliminary result)   Collection Time: 09/24/18 10:12 PM   Specimen: BLOOD LEFT HAND  Result Value Ref Range Status   Specimen Description   Final    BLOOD LEFT HAND Performed at [facility], [place] [address] [place], [place] [address]    Special Requests   Final    BOTTLES DRAWN AEROBIC AND ANAEROBIC Blood Culture adequate volume Performed at [facility] [address], [place], [place] [address]    Culture PENDING  Incomplete   Report Status PENDING  Incomplete      Radiology Studies: No results found.   Marzetta Board, MD, PhD [facility]  Contact via  [email or web address]  [place] P: [phone] F: [phone]

## 2018-09-26 LAB — BASIC METABOLIC PANEL
Anion gap: 10 (ref 5–15)
BUN: 45 mg/dL — ABNORMAL HIGH (ref 8–23)
CO2: 21 mmol/L — ABNORMAL LOW (ref 22–32)
Calcium: 8.9 mg/dL (ref 8.9–10.3)
Chloride: 103 mmol/L (ref 98–111)
Creatinine, Ser: 2.01 mg/dL — ABNORMAL HIGH (ref 0.61–1.24)
GFR calc Af Amer: 40 mL/min — ABNORMAL LOW (ref >60)
GFR calc non Af Amer: 35 mL/min — ABNORMAL LOW (ref >60)
Glucose, Bld: 158 mg/dL — ABNORMAL HIGH (ref 70–99)
Potassium: 5.2 mmol/L — ABNORMAL HIGH (ref 3.5–5.1)
Sodium: 134 mmol/L — ABNORMAL LOW (ref 135–145)

## 2018-09-26 LAB — D-DIMER, QUANTITATIVE: D-Dimer, Quant: 0.57 ug/mL-FEU — ABNORMAL HIGH (ref 0.00–0.50)

## 2018-09-26 LAB — CBC
HCT: 40.7 % (ref 39.0–52.0)
Hemoglobin: 13 g/dL (ref 13.0–17.0)
MCH: 29.1 pg (ref 26.0–34.0)
MCHC: 31.9 g/dL (ref 30.0–36.0)
MCV: 91.3 fL (ref 80.0–100.0)
Platelets: 250 10*3/uL (ref 150–400)
RBC: 4.46 MIL/uL (ref 4.22–5.81)
RDW: 14 % (ref 11.5–15.5)
WBC: 5.9 10*3/uL (ref 4.0–10.5)
nRBC: 0 % (ref 0.0–0.2)

## 2018-09-26 LAB — FERRITIN: Ferritin: 693 ng/mL — ABNORMAL HIGH (ref 24–336)

## 2018-09-26 LAB — GLUCOSE, CAPILLARY
Glucose-Capillary: 159 mg/dL — ABNORMAL HIGH (ref 70–99)
Glucose-Capillary: 184 mg/dL — ABNORMAL HIGH (ref 70–99)

## 2018-09-26 LAB — C-REACTIVE PROTEIN: CRP: 3.9 mg/dL — ABNORMAL HIGH (ref <1.0)

## 2018-09-26 MED ORDER — AMLODIPINE BESYLATE 5 MG PO TABS: 5.0000 mg | ORAL_TABLET | Freq: Every day | ORAL | 1 refills | Status: AC

## 2018-09-26 MED ORDER — GUAIFENESIN 100 MG/5ML PO SOLN: 5.0000 mL | ORAL | 0 refills | Status: AC

## 2018-09-26 MED ORDER — SODIUM POLYSTYRENE SULFONATE 15 GM/60ML PO SUSP
15.0000 g | Freq: Once | ORAL | Status: AC
  Administered 2018-09-26: 15 g via ORAL
  Filled 2018-09-26: qty 60

## 2018-09-26 MED ORDER — DEXAMETHASONE 6 MG PO TABS: 6.0000 mg | ORAL_TABLET | Freq: Every day | ORAL | 0 refills | Status: AC

## 2018-09-26 NOTE — Progress Notes (Signed)
Nsg Discharge Note  Admit Date:  09/22/2018 Discharge date: 09/26/2018   Tempie Hoist to be D/C'd home per MD order.  AVS completed.  Patient/caregiver able to verbalize understanding.  Discharge Medication: Allergies as of 09/26/2018      Reactions   Nsaids Other (See Comments)   GI BLEEDS   Shellfish-derived Products Nausea And Vomiting   Mussels   Aspirin Other (See Comments)   GI BLEEDS   Meloxicam Other (See Comments)   Stomach ulcer/bleeding   Tramadol Itching   Severe itching      Medication List    STOP taking these medications   acetaminophen 325 MG tablet Commonly known as: TYLENOL   gabapentin 400 MG capsule Commonly known as: NEURONTIN   lisinopril 20 MG tablet Commonly known as: ZESTRIL   methocarbamol 750 MG tablet Commonly known as: ROBAXIN   omeprazole 20 MG capsule Commonly known as: PRILOSEC   oxyCODONE 5 MG immediate release tablet Commonly known as: Oxy IR/ROXICODONE   penicillin v potassium 500 MG tablet Commonly known as: VEETID     TAKE these medications   amLODipine 5 MG tablet Commonly known as: NORVASC Take 1 tablet (5 mg total) by mouth daily. Notes to patient: Next dose due 7/26   dexamethasone 6 MG tablet Commonly known as: DECADRON Take 1 tablet (6 mg total) by mouth daily. Notes to patient: Next dose due 7/26   guaiFENesin 100 MG/5ML Soln Commonly known as: ROBITUSSIN Take 5 mLs (100 mg total) by mouth every 4 (four) hours. Notes to patient: Next dose due 7/25   metFORMIN 500 MG 24 hr tablet Commonly known as: GLUCOPHAGE-XR Take 1,000 mg by mouth 2 (two) times daily with a meal. Notes to patient: Next dose due 7/25       Discharge Assessment: Vitals:   09/26/18 0446 09/26/18 0851  BP: 121/72 117/71  Pulse: 67   Resp: (!) 22 18  Temp: 98.2 F (36.8 C) 97.7 F (36.5 C)  SpO2: 91% 94%   Skin clean, dry and intact without evidence of skin break down, no evidence of skin tears noted. IV catheter discontinued  intact. Site without signs and symptoms of complications - no redness or edema noted at insertion site, patient denies c/o pain - only slight tenderness at site.  Dressing with slight pressure applied.  D/c Instructions-Education: Discharge instructions given to patient/family with verbalized understanding. D/c education completed with patient/family including follow up instructions, medication list, d/c activities limitations if indicated, with other d/c instructions as indicated by MD - patient able to verbalize understanding, all questions fully answered. Patient instructed to return to ED, call 911, or call MD for any changes in condition.  Patient escorted via [place], and D/C home via private auto.  Jensyn Shave, Jolene Schimke, RN 09/26/2018 1:31 PM

## 2018-09-26 NOTE — Discharge Instructions (Signed)
COVID-19 Frequently Asked Questions °COVID-19 (coronavirus disease) is an infection that is caused by a large family of viruses. Some viruses cause illness in people and others cause illness in animals like camels, cats, and bats. In some cases, the viruses that cause illness in animals can spread to humans. °Where did the coronavirus come from? °In December 2019, [place] told the World Health Organization (WHO) of several cases of lung disease (human respiratory illness). These cases were linked to an open seafood and livestock market in the city of [place]. The link to the seafood and livestock market suggests that the virus may have spread from animals to humans. However, since that first outbreak in December, the virus has also been shown to spread from person to person. °What is the name of the disease and the virus? °Disease name °Early on, this disease was called novel coronavirus. This is because scientists determined that the disease was caused by a new (novel) respiratory virus. The World Health Organization (WHO) has now named the disease COVID-19, or coronavirus disease. °Virus name °The virus that causes the disease is called severe acute respiratory syndrome coronavirus 2 (SARS-CoV-2). °More information on disease and virus naming °World Health Organization (WHO): www.who.int/emergencies/diseases/novel-coronavirus-2019/technical-guidance/naming-the-coronavirus-disease-(covid-2019)-and-the-virus-that-causes-it °Who is at risk for complications from coronavirus disease? °Some people may be at higher risk for complications from coronavirus disease. This includes older adults and people who have chronic diseases, such as heart disease, diabetes, and lung disease. °If you are at higher risk for complications, take these extra precautions: °· Avoid close contact with people who are sick or have a fever or cough. Stay at least 3-6 ft (1-2 m) away from them, if possible. °· Wash your hands often with soap and  water for at least 20 seconds. °· Avoid touching your face, mouth, nose, or eyes. °· Keep supplies on hand at home, such as food, medicine, and cleaning supplies. °· Stay home as much as possible. °· Avoid social gatherings and travel. °How does coronavirus disease spread? °The virus that causes coronavirus disease spreads easily from person to person (is contagious). There are also cases of community-spread disease. This means the disease has spread to: °· People who have no known contact with other infected people. °· People who have not traveled to areas where there are known cases. °It appears to spread from one person to another through droplets from coughing or sneezing. °Can I get the virus from touching surfaces or objects? °There is still a lot that we do not know about the virus that causes coronavirus disease. Scientists are basing a lot of information on what they know about similar viruses, such as: °· Viruses cannot generally survive on surfaces for long. They need a human body (host) to survive. °· It is more likely that the virus is spread by close contact with people who are sick (direct contact), such as through: °? Shaking hands or hugging. °? Breathing in respiratory droplets that travel through the air. This can happen when an infected person coughs or sneezes on or near other people. °· It is less likely that the virus is spread when a person touches a surface or object that has the virus on it (indirect contact). The virus may be able to enter the body if the person touches a surface or object and then touches his or her face, eyes, nose, or mouth. °Can a person spread the virus without having symptoms of the disease? °It may be possible for the virus to spread before a person   has symptoms of the disease, but this is most likely not the main way the virus is spreading. It is more likely for the virus to spread by being in close contact with people who are sick and breathing in the respiratory  droplets of a sick person's cough or sneeze. °What are the symptoms of coronavirus disease? °Symptoms vary from person to person and can range from mild to severe. Symptoms may include: °· Fever. °· Cough. °· Tiredness, weakness, or fatigue. °· Fast breathing or feeling short of breath. °These symptoms can appear anywhere from 2 to 14 days after you have been exposed to the virus. If you develop symptoms, call your health care provider. People with severe symptoms may need hospital care. °If I am exposed to the virus, how long does it take before symptoms start? °Symptoms of coronavirus disease may appear anywhere from 2 to 14 days after a person has been exposed to the virus. If you develop symptoms, call your health care provider. °Should I be tested for this virus? °Your health care provider will decide whether to test you based on your symptoms, history of exposure, and your risk factors. °How does a health care provider test for this virus? °Health care providers will collect samples to send for testing. Samples may include: °· Taking a swab of fluid from the nose. °· Taking fluid from the lungs by having you cough up mucus (sputum) into a sterile cup. °· Taking a blood sample. °· Taking a stool or urine sample. °Is there a treatment or vaccine for this virus? °Currently, there is no vaccine to prevent coronavirus disease. Also, there are no medicines like antibiotics or antivirals to treat the virus. A person who becomes sick is given supportive care, which means rest and fluids. A person may also relieve his or her symptoms by using over-the-counter medicines that treat sneezing, coughing, and runny nose. These are the same medicines that a person takes for the common cold. °If you develop symptoms, call your health care provider. People with severe symptoms may need hospital care. °What can I do to protect myself and my family from this virus? ° °  ° °You can protect yourself and your family by taking the  same actions that you would take to prevent the spread of other viruses. Take the following actions: °· Wash your hands often with soap and water for at least 20 seconds. If soap and water are not available, use alcohol-based hand sanitizer. °· Avoid touching your face, mouth, nose, or eyes. °· Cough or sneeze into a tissue, sleeve, or elbow. Do not cough or sneeze into your hand or the air. °? If you cough or sneeze into a tissue, throw it away immediately and wash your hands. °· Disinfect objects and surfaces that you frequently touch every day. °· Avoid close contact with people who are sick or have a fever or cough. Stay at least 3-6 ft (1-2 m) away from them, if possible. °· Stay home if you are sick, except to get medical care. Call your health care provider before you get medical care. °· Make sure your vaccines are up to date. Ask your health care provider what vaccines you need. °What should I do if I need to travel? °Follow travel recommendations from your local health authority, the CDC, and WHO. °Travel information and advice °· Centers for Disease Control and Prevention (CDC): www.cdc.gov/coronavirus/2019-ncov/travelers/index.html °· World Health Organization (WHO): www.who.int/emergencies/diseases/novel-coronavirus-2019/travel-advice °Know the risks and take action to protect your health °·   You are at higher risk of getting coronavirus disease if you are traveling to areas with an outbreak or if you are exposed to travelers from areas with an outbreak. °· Wash your hands often and practice good hygiene to lower the risk of catching or spreading the virus. °What should I do if I am sick? °General instructions to stop the spread of infection °· Wash your hands often with soap and water for at least 20 seconds. If soap and water are not available, use alcohol-based hand sanitizer. °· Cough or sneeze into a tissue, sleeve, or elbow. Do not cough or sneeze into your hand or the air. °· If you cough or  sneeze into a tissue, throw it away immediately and wash your hands. °· Stay home unless you must get medical care. Call your health care provider or local health authority before you get medical care. °· Avoid public areas. Do not take public transportation, if possible. °· If you can, wear a mask if you must go out of the house or if you are in close contact with someone who is not sick. °Keep your home clean °· Disinfect objects and surfaces that are frequently touched every day. This may include: °? Counters and tables. °? Doorknobs and light switches. °? Sinks and faucets. °? Electronics such as phones, remote controls, keyboards, computers, and tablets. °· Wash dishes in hot, soapy water or use a dishwasher. Air-dry your dishes. °· Wash laundry in hot water. °Prevent infecting other household members °· Let healthy household members care for children and pets, if possible. If you have to care for children or pets, wash your hands often and wear a mask. °· Sleep in a different bedroom or bed, if possible. °· Do not share personal items, such as razors, toothbrushes, deodorant, combs, brushes, towels, and washcloths. °Where to find more information °Centers for Disease Control and Prevention (CDC) °· Information and news updates: www.cdc.gov/coronavirus/2019-ncov °World Health Organization (WHO) °· Information and news updates: www.who.int/emergencies/diseases/novel-coronavirus-2019 °· Coronavirus health topic: www.who.int/health-topics/coronavirus °· Questions and answers on COVID-19: www.who.int/news-room/q-a-detail/q-a-coronaviruses °· Global tracker: who.sprinklr.com °American Academy of Pediatrics (AAP) °· Information for families: www.healthychildren.org/English/health-issues/conditions/chest-lungs/Pages/2019-Novel-Coronavirus.aspx °The coronavirus situation is changing rapidly. Check your local health authority website or the CDC and WHO websites for updates and news. °When should I contact a health care  provider? °· Contact your health care provider if you have symptoms of an infection, such as fever or cough, and you: °? Have been near anyone who is known to have coronavirus disease. °? Have come into contact with a person who is suspected to have coronavirus disease. °? Have traveled outside of the country. °When should I get emergency medical care? °· Get help right away by calling your local emergency services (911 in the [place] if you have: °? Trouble breathing. °? Pain or pressure in your chest. °? Confusion. °? Blue-tinged lips and fingernails. °? Difficulty waking from sleep. °? Symptoms that get worse. °Let the emergency medical personnel know if you think you have coronavirus disease. °Summary °· A new respiratory virus is spreading from person to person and causing COVID-19 (coronavirus disease). °· The virus that causes COVID-19 appears to spread easily. It spreads from one person to another through droplets from coughing or sneezing. °· Older adults and those with chronic diseases are at higher risk of disease. If you are at higher risk for complications, take extra precautions. °· There is currently no vaccine to prevent coronavirus disease. There are no medicines, such as antibiotics or   antivirals, to treat the virus. °· You can protect yourself and your family by washing your hands often, avoiding touching your face, and covering your coughs and sneezes. °This information is not intended to replace advice given to you by your health care provider. Make sure you discuss any questions you have with your health care provider. °Document Released: 06/16/2018 Document Revised: 06/16/2018 Document Reviewed: 06/16/2018 °[employer]vier Patient Education © 2020 [employer] ° ° °COVID-19 °COVID-19 is a respiratory infection that is caused by a virus called severe acute respiratory syndrome coronavirus 2 (SARS-CoV-2). The disease is also known as coronavirus disease or novel coronavirus. In some people, the virus  may not cause any symptoms. In others, it may cause a serious infection. The infection can get worse quickly and can lead to complications, such as: °· Pneumonia, or infection of the lungs. °· Acute respiratory distress syndrome or ARDS. This is fluid build-up in the lungs. °· Acute respiratory failure. This is a condition in which there is not enough oxygen passing from the lungs to the body. °· Sepsis or septic shock. This is a serious bodily reaction to an infection. °· Blood clotting problems. °· Secondary infections due to bacteria or fungus. °The virus that causes COVID-19 is contagious. This means that it can spread from person to person through droplets from coughs and sneezes (respiratory secretions). °What are the causes? °This illness is caused by a virus. You may catch the virus by: °· Breathing in droplets from an infected person's cough or sneeze. °· Touching something, like a table or a doorknob, that was exposed to the virus (contaminated) and then touching your mouth, nose, or eyes. °What increases the risk? °Risk for infection °You are more likely to be infected with this virus if you: °· Live in or travel to an area with a COVID-19 outbreak. °· Come in contact with a sick person who recently traveled to an area with a COVID-19 outbreak. °· Provide care for or live with a person who is infected with COVID-19. °Risk for serious illness °You are more likely to become seriously ill from the virus if you: °· Are 65 years of age or older. °· Have a long-term disease that lowers your body's ability to fight infection (immunocompromised). °· Live in a nursing home or long-term care facility. °· Have a long-term (chronic) disease such as: °? Chronic lung disease, including chronic obstructive pulmonary disease or asthma °? Heart disease. °? Diabetes. °? Chronic kidney disease. °? Liver disease. °· Are obese. °What are the signs or symptoms? °Symptoms of this condition can range from mild to severe.  Symptoms may appear any time from 2 to 14 days after being exposed to the virus. They include: °· A fever. °· A cough. °· Difficulty breathing. °· Chills. °· Muscle pains. °· A sore throat. °· Loss of taste or smell. °Some people may also have stomach problems, such as nausea, vomiting, or diarrhea. °Other people may not have any symptoms of COVID-19. °How is this diagnosed? °This condition may be diagnosed based on: °· Your signs and symptoms, especially if: °? You live in an area with a COVID-19 outbreak. °? You recently traveled to or from an area where the virus is common. °? You provide care for or live with a person who was diagnosed with COVID-19. °· A physical exam. °· Lab tests, which may include: °? A nasal swab to take a sample of fluid from your nose. °? A throat swab to take a sample of   fluid from your throat. °? A sample of mucus from your lungs (sputum). °? Blood tests. °· Imaging tests, which may include, X-rays, CT scan, or ultrasound. °How is this treated? °At present, there is no medicine to treat COVID-19. Medicines that treat other diseases are being used on a trial basis to see if they are effective against COVID-19. °Your health care provider will talk with you about ways to treat your symptoms. For most people, the infection is mild and can be managed at home with rest, fluids, and over-the-counter medicines. °Treatment for a serious infection usually takes places in a hospital intensive care unit (ICU). It may include one or more of the following treatments. These treatments are given until your symptoms improve. °· Receiving fluids and medicines through an IV. °· Supplemental oxygen. Extra oxygen is given through a tube in the nose, a face mask, or a hood. °· Positioning you to lie on your stomach (prone position). This makes it easier for oxygen to get into the lungs. °· Continuous positive airway pressure (CPAP) or bi-level positive airway pressure (BPAP) machine. This treatment uses mild  air pressure to keep the airways open. A tube that is connected to a motor delivers oxygen to the body. °· Ventilator. This treatment moves air into and out of the lungs by using a tube that is placed in your windpipe. °· Tracheostomy. This is a procedure to create a hole in the neck so that a breathing tube can be inserted. °· Extracorporeal membrane oxygenation (ECMO). This procedure gives the lungs a chance to recover by taking over the functions of the heart and lungs. It supplies oxygen to the body and removes carbon dioxide. °Follow these instructions at home: °Lifestyle °· If you are sick, stay home except to get medical care. Your health care provider will tell you how long to stay home. Call your health care provider before you go for medical care. °· Rest at home as told by your health care provider. °· Do not use any products that contain nicotine or tobacco, such as cigarettes, e-cigarettes, and chewing tobacco. If you need help quitting, ask your health care provider. °· Return to your normal activities as told by your health care provider. Ask your health care provider what activities are safe for you. °General instructions °· Take over-the-counter and prescription medicines only as told by your health care provider. °· Drink enough fluid to keep your urine pale yellow. °· Keep all follow-up visits as told by your health care provider. This is important. °How is this prevented? ° °There is no vaccine to help prevent COVID-19 infection. However, there are steps you can take to protect yourself and others from this virus. °To protect yourself:  °· Do not travel to areas where COVID-19 is a risk. The areas where COVID-19 is reported change often. To identify high-risk areas and travel restrictions, check the CDC travel website: wwwnc.cdc.gov/travel/notices °· If you live in, or must travel to, an area where COVID-19 is a risk, take precautions to avoid infection. °? Stay away from people who are  sick. °? Wash your hands often with soap and water for 20 seconds. If soap and water are not available, use an alcohol-based hand sanitizer. °? Avoid touching your mouth, face, eyes, or nose. °? Avoid going out in public, follow guidance from your state and local health authorities. °? If you must go out in public, wear a cloth face covering or face mask. °? Disinfect objects and surfaces that are   frequently touched every day. This may include: °§ Counters and tables. °§ Doorknobs and light switches. °§ Sinks and faucets. °§ Electronics, such as phones, remote controls, keyboards, computers, and tablets. °To protect others: °If you have symptoms of COVID-19, take steps to prevent the virus from spreading to others. °· If you think you have a COVID-19 infection, contact your health care provider right away. Tell your health care team that you think you may have a COVID-19 infection. °· Stay home. Leave your house only to seek medical care. Do not use public transport. °· Do not travel while you are sick. °· Wash your hands often with soap and water for 20 seconds. If soap and water are not available, use alcohol-based hand sanitizer. °· Stay away from other members of your household. Let healthy household members care for children and pets, if possible. If you have to care for children or pets, wash your hands often and wear a mask. If possible, stay in your own room, separate from others. Use a different bathroom. °· Make sure that all people in your household wash their hands well and often. °· Cough or sneeze into a tissue or your sleeve or elbow. Do not cough or sneeze into your hand or into the air. °· Wear a cloth face covering or face mask. °Where to find more information °· Centers for Disease Control and Prevention: www.cdc.gov/coronavirus/2019-ncov/index.html °· World Health Organization: www.who.int/health-topics/coronavirus °Contact a health care provider if: °· You live in or have traveled to an area  where COVID-19 is a risk and you have symptoms of the infection. °· You have had contact with someone who has COVID-19 and you have symptoms of the infection. °Get help right away if: °· You have trouble breathing. °· You have pain or pressure in your chest. °· You have confusion. °· You have bluish lips and fingernails. °· You have difficulty waking from sleep. °· You have symptoms that get worse. °These symptoms may represent a serious problem that is an emergency. Do not wait to see if the symptoms will go away. Get medical help right away. Call your local emergency services (911 in the [place] Do not drive yourself to the hospital. Let the emergency medical personnel know if you think you have COVID-19. °Summary °· COVID-19 is a respiratory infection that is caused by a virus. It is also known as coronavirus disease or novel coronavirus. It can cause serious infections, such as pneumonia, acute respiratory distress syndrome, acute respiratory failure, or sepsis. °· The virus that causes COVID-19 is contagious. This means that it can spread from person to person through droplets from coughs and sneezes. °· You are more likely to develop a serious illness if you are 65 years of age or older, have a weak immunity, live in a nursing home, or have chronic disease. °· There is no medicine to treat COVID-19. Your health care provider will talk with you about ways to treat your symptoms. °· Take steps to protect yourself and others from infection. Wash your hands often and disinfect objects and surfaces that are frequently touched every day. Stay away from people who are sick and wear a mask if you are sick. °This information is not intended to replace advice given to you by your health care provider. Make sure you discuss any questions you have with your health care provider. °Document Released: 03/26/2018 Document Revised: 07/16/2018 Document Reviewed: 03/26/2018 °Elsevier Patient Education © 2020 [employer] ° °COVID-19: How to Protect Yourself and   Others °Know how it spreads °· There is currently no vaccine to prevent coronavirus disease 2019 (COVID-19). °· The best way to prevent illness is to avoid being exposed to this virus. °· The virus is thought to spread mainly from person-to-person. °? Between people who are in close contact with one another (within about 6 feet). °? Through respiratory droplets produced when an infected person coughs, sneezes or talks. °? These droplets can land in the mouths or noses of people who are nearby or possibly be inhaled into the lungs. °? Some recent studies have suggested that COVID-19 may be spread by people who are not showing symptoms. °Everyone should °Clean your hands often °· Wash your hands often with soap and water for at least 20 seconds especially after you have been in a public place, or after blowing your nose, coughing, or sneezing. °· If soap and water are not readily available, use a hand sanitizer that contains at least 60% alcohol. Cover all surfaces of your hands and rub them together until they feel dry. °· Avoid touching your eyes, nose, and mouth with unwashed hands. °Avoid close contact °· Stay home if you are sick. °· Avoid close contact with people who are sick. °· Put distance between yourself and other people. °? Remember that some people without symptoms may be able to spread virus. °? This is especially important for people who are at higher risk of getting very sick.www.cdc.gov/coronavirus/2019-ncov/need-extra-precautions/people-at-higher-risk.html °Cover your mouth and nose with a cloth face cover when around others °· You could spread COVID-19 to others even if you do not feel sick. °· Everyone should wear a cloth face cover when they have to go out in public, for example to the grocery store or to pick up other necessities. °? Cloth face coverings should not be placed on young children under age 2, anyone who has trouble breathing, or is  unconscious, incapacitated or otherwise unable to remove the mask without assistance. °· The cloth face cover is meant to protect other people in case you are infected. °· Do NOT use a facemask meant for a healthcare worker. °· Continue to keep about 6 feet between yourself and others. The cloth face cover is not a substitute for social distancing. °Cover coughs and sneezes °· If you are in a private setting and do not have on your cloth face covering, remember to always cover your mouth and nose with a tissue when you cough or sneeze or use the inside of your elbow. °· Throw used tissues in the trash. °· Immediately wash your hands with soap and water for at least 20 seconds. If soap and water are not readily available, clean your hands with a hand sanitizer that contains at least 60% alcohol. °Clean and disinfect °· Clean AND disinfect frequently touched surfaces daily. This includes tables, doorknobs, light switches, countertops, handles, desks, phones, keyboards, toilets, faucets, and sinks. www.cdc.gov/coronavirus/2019-ncov/prevent-getting-sick/disinfecting-your-home.html °· If surfaces are dirty, clean them: Use detergent or soap and water prior to disinfection. °· Then, use a household disinfectant. You can see a list of EPA-registered household disinfectants here. °cdc.gov/coronavirus °07/07/2018 °This information is not intended to replace advice given to you by your health care provider. Make sure you discuss any questions you have with your health care provider. °Document Released: 06/16/2018 Document Revised: 07/15/2018 Document Reviewed: 06/16/2018 °Elsevier Patient Education © 2020 Elsevier Inc. ° °

## 2018-09-26 NOTE — Discharge Summary (Signed)
Physician Discharge Summary  Andre MulderJohn Gluth ZOX:[id number] DOB: 03/15/1955 DOA: 09/22/2018  PCP: System, Pcp Not In  Admit date: 09/22/2018 Discharge date: 09/26/2018  Admitted From: home Disposition:  home  Recommendations for Outpatient Follow-up:  1. Follow up with PCP in 1-2 weeks 2. Please obtain BMP/CBC in one week  Home Health: none Equipment/Devices: none  Discharge Condition: stable CODE STATUS: Full code Diet recommendation: renal  HPI: Per admitting MD, Andre Rodriguez is a [age] y.o. male with medical history significant of hypertension on lisinopril, type 2 diabetes on metformin presented to the emergency room with body ache, myalgia, weakness and fatigue for about 4 days and symptoms worse for 2 days.  Patient also has a nonproductive cough.  He had some chills at home.  He did not measure his temperature at home.  He has multiple coworkers that are positive for COVID-19.  Patient has poor appetite. No sputum production.  Denies any shortness of breath.  Increased urinary frequency and polyuria polydipsia.  Fatigue and weakness. ED Course: Febrile with temperature of 100.5.  Blood pressures are adequate.  Patient on room air 95%.  Sodium 133.  BUN/creatinine 42/3.37, recent creatinine 1.48 few months ago.  Chest x-ray is clear.  COVID-19 positive.  Urine with no evidence of infection. Patient was only given 500 mL fluid in the ER.  Hospital Course: Principal Problem Acute kidney injury on chronic kidney disease stage III -this is likely in the setting of poor p.o. intake and dehydration as well as use of lisinopril.  His creatinine back in 2017 was ranging from 1.2-1.6, without any values since.  Suspect a degree of chronic kidney disease.  His creatinine on admission was above 3, with fluids is improved to 2.0 and has remained stable.  He is tolerating a regular diet, he is back to baseline, asking to go home and will be discharged in stable condition.  He was advised to follow-up  with primary MD within 1 to 2 weeks for repeat renal function.  His lisinopril has been held on discharge Active problems: Covid-19 Viral Illness -Patient started on steroids due to increased inflammatory markers, continue, he was initially started on Remdesivir however had no hypoxia, chest x-ray did not show any signs of infiltrates and he did not meet criteria for Remdesivir.  This was discontinued, he is remained stable.  He is to finish a course of steroids Type 2 diabetes mellitus -resume home metformin Hypertension -hold lisinopril due to creatinine elevation, switched to amlodipine Hyperkalemia -Likely in the setting of acute kidney injury, received Kayexalate.  Hold lisinopril.   Discharge Diagnoses:  Active Problems:   Diabetes mellitus type 2, noninsulin dependent (HCC)   HTN (hypertension)   AKI (acute kidney injury) (HCC)   COVID-19 virus infection   CKD (chronic kidney disease), stage III (HCC)   Acute renal failure (HCC)   Acute kidney injury with acute tubular necrosis (HCC)   Hyponatremia     Discharge Instructions   Allergies as of 09/26/2018      Reactions   Nsaids Other (See Comments)   GI BLEEDS   Shellfish-derived Products Nausea And Vomiting   Mussels   Aspirin Other (See Comments)   GI BLEEDS   Meloxicam Other (See Comments)   Stomach ulcer/bleeding   Tramadol Itching   Severe itching      Medication List    STOP taking these medications   acetaminophen 325 MG tablet Commonly known as: TYLENOL   gabapentin 400 MG capsule Commonly known  as: NEURONTIN   lisinopril 20 MG tablet Commonly known as: ZESTRIL   methocarbamol 750 MG tablet Commonly known as: ROBAXIN   omeprazole 20 MG capsule Commonly known as: PRILOSEC   oxyCODONE 5 MG immediate release tablet Commonly known as: Oxy IR/ROXICODONE   penicillin v potassium 500 MG tablet Commonly known as: VEETID     TAKE these medications   amLODipine 5 MG tablet Commonly known as:  NORVASC Take 1 tablet (5 mg total) by mouth daily.   dexamethasone 6 MG tablet Commonly known as: DECADRON Take 1 tablet (6 mg total) by mouth daily.   guaiFENesin 100 MG/5ML Soln Commonly known as: ROBITUSSIN Take 5 mLs (100 mg total) by mouth every 4 (four) hours.   metFORMIN 500 MG 24 hr tablet Commonly known as: GLUCOPHAGE-XR Take 1,000 mg by mouth 2 (two) times daily with a meal.      Follow-up Information    Wilburn MylarKelly, Samuel S, MD. Schedule an appointment as soon as possible for a visit in 1 week(s).   Specialty: Family Medicine Why: for repeating kidney function Contact information: [address] [place] [address] [phone]           Consultations:  None   Procedures/Studies:  Dg Chest Portable 1 View  Result Date: 09/22/2018 CLINICAL DATA:  Cough and fever EXAM: PORTABLE CHEST 1 VIEW COMPARISON:  02/22/2016 FINDINGS: Cardiac shadow is within normal limits. Elevation the right hemidiaphragm is noted increased when compared with the prior exam. No focal infiltrate or sizable effusion is seen. No bony abnormality is noted. IMPRESSION: No acute abnormality noted. Electronically Signed   By: Alcide CleverMark  Lukens M.D.   On: 09/22/2018 23:43      Subjective: - no chest pain, shortness of breath, no abdominal pain, nausea or vomiting.   Discharge Exam: BP 117/71 (BP Location: Left Arm)   Pulse 67   Temp 97.7 F (36.5 C) (Oral)   Resp 18   Ht 5\' 11"  (1.803 m)   Wt 107.2 kg   SpO2 94%   BMI 32.96 kg/m   General: Pt is alert, awake, not in acute distress Cardiovascular: RRR, S1/S2 +, no rubs, no gallops Respiratory: CTA bilaterally, no wheezing, no rhonchi Abdominal: Soft, NT, ND, bowel sounds + Extremities: no edema, no cyanosis    The results of significant diagnostics from this hospitalization (including imaging, microbiology, ancillary and laboratory) are listed below for reference.     Microbiology: Recent Results (from the past 240  hour(s))  SARS Coronavirus 2 (CEPHEID - Performed in [facility] lab), [facility] Order     Status: Abnormal   Collection Time: 09/23/18 12:59 AM   Specimen: Nasopharyngeal Swab  Result Value Ref Range Status   SARS Coronavirus 2 POSITIVE (A) NEGATIVE Final    Comment: RESULT CALLED TO, READ BACK BY AND VERIFIED WITH: WICKER,K @ 0424 ON [id number] BY POTEAT,S Performed at [facility], [address] [place], [place] [address]   Culture, blood (routine x 2)     Status: None (Preliminary result)   Collection Time: 09/24/18 10:12 PM   Specimen: BLOOD  Result Value Ref Range Status   Specimen Description   Final    BLOOD LEFT ANTECUBITAL Performed at [facility], [address] [place], [place] [address]    Special Requests   Final    BOTTLES DRAWN AEROBIC AND ANAEROBIC Blood Culture adequate volume Performed at [facility], [address] [place], [place] [address]  Culture   Final    NO GROWTH 1 DAY Performed at [facility], [address] [place], [place] [address]    Report Status PENDING  Incomplete  Culture, blood (routine x 2)     Status: None (Preliminary result)   Collection Time: 09/24/18 10:12 PM   Specimen: BLOOD LEFT HAND  Result Value Ref Range Status   Specimen Description   Final    BLOOD LEFT HAND Performed at [facility], [address] [place], [place] [address]    Special Requests   Final    BOTTLES DRAWN AEROBIC AND ANAEROBIC Blood Culture adequate volume Performed at [facility], [address] [place], [place] [address]    Culture   Final    NO GROWTH 1 DAY Performed at [facility], [address] [place], [place] [address]    Report Status PENDING  Incomplete     Labs: BNP (last 3 results) Recent Labs    09/24/18 1205  BNP 11.1   Basic Metabolic Panel: Recent Labs  Lab 09/23/18 0002 09/24/18 0458 09/25/18 0800 09/26/18 0147  NA  133* 133* 130* 134*  K 5.0 5.1 5.7* 5.2*  CL 99 102 102 103  CO2 22 18* 19* 21*  GLUCOSE 116* 105* 139* 158*  BUN 42* 35* 35* 45*  CREATININE 3.37* 2.49* 2.20* 2.01*  CALCIUM 8.7* 8.7* 8.8* 8.9   Liver Function Tests: Recent Labs  Lab 09/23/18 0002 09/25/18 0800  AST 34 41  ALT 21 26  ALKPHOS 53 55  BILITOT 0.5 0.6  PROT 8.1 8.0  ALBUMIN 3.9 3.7   No results for input(s): LIPASE, AMYLASE in the last 168 hours. No results for input(s): AMMONIA in the last 168 hours. CBC: Recent Labs  Lab 09/23/18 0002 09/24/18 0458 09/25/18 0800 09/26/18 0147  WBC 8.4 7.6 4.5 5.9  NEUTROABS 5.9  --  3.4  --   HGB 13.2 13.6 13.5 13.0  HCT 40.7 42.0 41.3 40.7  MCV 93.8 92.9 91.2 91.3  PLT 224 207 225 250   Cardiac Enzymes: Recent Labs  Lab 09/25/18 0800  CKTOTAL 957*   BNP: Invalid input(s): POCBNP CBG: Recent Labs  Lab 09/25/18 0748 09/25/18 1142 09/25/18 1643 09/25/18 2127 09/26/18 0849  GLUCAP 129* 152* 116* 154* 159*   D-Dimer Recent Labs    09/25/18 0800 09/26/18 0147  DDIMER 0.69* 0.57*   Hgb A1c No results for input(s): HGBA1C in the last 72 hours. Lipid Profile No results for input(s): CHOL, HDL, LDLCALC, TRIG, CHOLHDL, LDLDIRECT in the last 72 hours. Thyroid function studies No results for input(s): TSH, T4TOTAL, T3FREE, THYROIDAB in the last 72 hours.  Invalid input(s): FREET3 Anemia work up Recent Labs    09/24/18 1205 09/26/18 0147  FERRITIN 577* 693*   Urinalysis    Component Value Date/Time   COLORURINE YELLOW 09/22/2018 2319   APPEARANCEUR HAZY (A) 09/22/2018 2319   LABSPEC 1.015 09/22/2018 2319   PHURINE 5.0 09/22/2018 2319   GLUCOSEU NEGATIVE 09/22/2018 2319   HGBUR SMALL (A) 09/22/2018 2319   BILIRUBINUR NEGATIVE 09/22/2018 2319   KETONESUR NEGATIVE 09/22/2018 2319   PROTEINUR 30 (A) 09/22/2018 2319   UROBILINOGEN 0.2 11/07/2013 2030   NITRITE NEGATIVE 09/22/2018 2319   LEUKOCYTESUR NEGATIVE 09/22/2018 2319   Sepsis Labs  Invalid input(s): PROCALCITONIN,  WBC,  LACTICIDVEN  FURTHER DISCHARGE INSTRUCTIONS:   Get Medicines reviewed and adjusted: Please take all your medications with you for your next visit with  your Primary MD   Laboratory/radiological data: Please request your Primary MD to go over all hospital tests and procedure/radiological results at the follow up, please ask your Primary MD to get all Hospital records sent to his/her office.   In some cases, they will be blood work, cultures and biopsy results pending at the time of your discharge. Please request that your primary care M.D. goes through all the records of your hospital data and follows up on these results.   Also Note the following: If you experience worsening of your admission symptoms, develop shortness of breath, life threatening emergency, suicidal or homicidal thoughts you must seek medical attention immediately by calling 911 or calling your MD immediately  if symptoms less severe.   You must read complete instructions/literature along with all the possible adverse reactions/side effects for all the Medicines you take and that have been prescribed to you. Take any new Medicines after you have completely understood and accpet all the possible adverse reactions/side effects.    Do not drive when taking Pain medications or sleeping medications (Benzodaizepines)   Do not take more than prescribed Pain, Sleep and Anxiety Medications. It is not advisable to combine anxiety,sleep and pain medications without talking with your primary care practitioner   Special Instructions: If you have smoked or chewed Tobacco  in the last 2 yrs please stop smoking, stop any regular Alcohol  and or any Recreational drug use.   Wear Seat belts while driving.   Please note: You were cared for by a hospitalist during your hospital stay. Once you are discharged, your primary care physician will handle any further medical issues. Please note that NO REFILLS  for any discharge medications will be authorized once you are discharged, as it is imperative that you return to your primary care physician (or establish a relationship with a primary care physician if you do not have one) for your post hospital discharge needs so that they can reassess your need for medications and monitor your lab values.  Time coordinating discharge: 40 minutes  SIGNED:  Marzetta Board, MD, PhD 09/26/2018, 9:48 AM

## 2018-09-30 LAB — CULTURE, BLOOD (ROUTINE X 2)
Culture: NO GROWTH
Culture: NO GROWTH
Special Requests: ADEQUATE
Special Requests: ADEQUATE

## 2018-10-17 DIAGNOSIS — Z8619 Personal history of other infectious and parasitic diseases: Secondary | ICD-10-CM | POA: Diagnosis not present

## 2018-10-17 DIAGNOSIS — Z1159 Encounter for screening for other viral diseases: Secondary | ICD-10-CM | POA: Diagnosis not present

## 2019-05-15 ENCOUNTER — Ambulatory Visit: Payer: Medicare Other | Attending: Internal Medicine

## 2019-05-15 DIAGNOSIS — Z23 Encounter for immunization: Secondary | ICD-10-CM

## 2019-05-15 NOTE — Progress Notes (Signed)
   Covid-19 Vaccination Clinic  Name:  Andre Rodriguez    MRN: [id number] DOB: [birth year]  05/15/2019  [name] was observed post Covid-19 immunization for 15 minutes without incident. He was provided with Vaccine Information Sheet and instruction to access the V-Safe system.   [name] was instructed to call 911 with any severe reactions post vaccine: Marland Kitchen Difficulty breathing  . Swelling of face and throat  . A fast heartbeat  . A bad rash all over body  . Dizziness and weakness   Immunizations Administered    Name Date Dose VIS Date Route   Pfizer COVID-19 Vaccine 05/15/2019  2:03 PM 0.3 mL 02/12/2019 Intramuscular   Manufacturer: [employer], [employer]   Lot: [id number]   NDC: [address]-7

## 2019-06-09 ENCOUNTER — Ambulatory Visit: Payer: Medicare HMO | Attending: Internal Medicine

## 2019-06-09 DIAGNOSIS — Z23 Encounter for immunization: Secondary | ICD-10-CM

## 2019-06-09 NOTE — Progress Notes (Signed)
   Covid-19 Vaccination Clinic  Name:  Andre Rodriguez    MRN: [id number] DOB: May 02, 1955  06/09/2019  Andre Rodriguez was observed post Covid-19 immunization for 15 minutes without incident. He was provided with Vaccine Information Sheet and instruction to access the V-Safe system.   Andre Rodriguez was instructed to call 911 with any severe reactions post vaccine: Marland Kitchen Difficulty breathing  . Swelling of face and throat  . A fast heartbeat  . A bad rash all over body  . Dizziness and weakness   Immunizations Administered    Name Date Dose VIS Date Route   Pfizer COVID-19 Vaccine 06/09/2019 12:24 PM 0.3 mL 02/12/2019 Intramuscular   Manufacturer: [employer], [employer]   Lot: [id number]   NDC: [address]-9

## 2019-12-06 ENCOUNTER — Ambulatory Visit: Payer: Self-pay | Admitting: Internal Medicine

## 2019-12-20 ENCOUNTER — Emergency Department (HOSPITAL_COMMUNITY)
Admission: EM | Admit: 2019-12-20 | Discharge: 2019-12-20 | Discharge status: Against Medical Advice | Payer: Medicare HMO

## 2019-12-20 NOTE — ED Notes (Signed)
No answer when called for vitals or triage x2

## 2022-09-13 ENCOUNTER — Other Ambulatory Visit: Payer: Self-pay

## 2022-09-13 ENCOUNTER — Encounter (HOSPITAL_BASED_OUTPATIENT_CLINIC_OR_DEPARTMENT_OTHER): Payer: Self-pay | Admitting: Emergency Medicine

## 2022-09-13 ENCOUNTER — Emergency Department (HOSPITAL_BASED_OUTPATIENT_CLINIC_OR_DEPARTMENT_OTHER)
Admission: EM | Admit: 2022-09-13 | Discharge: 2022-09-13 | Disposition: A | Discharge status: Home / Self Care | Payer: Medicare HMO | Source: Home / Self Care | Attending: Emergency Medicine | Admitting: Emergency Medicine

## 2022-09-13 DIAGNOSIS — Y69 Unspecified misadventure during surgical and medical care: Secondary | ICD-10-CM | POA: Insufficient documentation

## 2022-09-13 DIAGNOSIS — R300 Dysuria: Secondary | ICD-10-CM | POA: Diagnosis not present

## 2022-09-13 DIAGNOSIS — T83098A Other mechanical complication of other indwelling urethral catheter, initial encounter: Secondary | ICD-10-CM | POA: Diagnosis not present

## 2022-09-13 DIAGNOSIS — T839XXA Unspecified complication of genitourinary prosthetic device, implant and graft, initial encounter: Secondary | ICD-10-CM

## 2022-09-13 MED ORDER — FENTANYL CITRATE PF 50 MCG/ML IJ SOSY
150.0000 ug | PREFILLED_SYRINGE | Freq: Once | INTRAMUSCULAR | Status: AC
  Administered 2022-09-13: 150 ug via INTRAVENOUS
  Filled 2022-09-13: qty 3

## 2022-09-13 MED ORDER — MIDAZOLAM HCL 2 MG/2ML IJ SOLN
0.5000 mg | Freq: Once | INTRAMUSCULAR | Status: AC
  Administered 2022-09-13: 0.5 mg via INTRAVENOUS
  Filled 2022-09-13: qty 2

## 2022-09-13 NOTE — ED Notes (Signed)
Taken off O2  Will continue to monitor

## 2022-09-13 NOTE — ED Notes (Signed)
Placed on O2 via [place] at 2 L post pain medications due to sats dropping to 90%

## 2022-09-13 NOTE — ED Triage Notes (Signed)
Pt arrives to ED with c/o urinary catheter problem. Pt notes prostate surgery yesterday and a urinary catheter was placed. Pt notes that he has not had any urine in bag since early this morning and is experiencing sharp bladder pains.

## 2022-09-13 NOTE — ED Notes (Signed)
Patient given pain medications then bladder irrigated with 70 cc Sterile water and peroxide 50/50 mix for poor return.  Balloon deflated for only 3cc and could not get any more.  Urine began to flow.  Balloon re inflated with 10cc NS for proper inflation.  Bloody urine continues to flow for 400 cc output.  Will continue to monitor

## 2022-09-13 NOTE — Discharge Instructions (Signed)
Follow up with your urologist in the office.  Please return for worsening symptoms.

## 2022-09-13 NOTE — ED Notes (Signed)
Bladder scan done for 299 cc.  Blood in catheter noted.  Attempt to irrigate with 40cc.  Able to infused but no return.  Dr. Adela Lank notified

## 2022-09-13 NOTE — ED Provider Notes (Signed)
Andre Rodriguez Provider Note   CSN: [id number] Arrival date & time: 09/13/22  1738     History  Chief Complaint  Patient presents with   Urinary Catheter Problem    Andre Rodriguez is a 67 y.o. male.  67 yo M with a chief complaints of pain at his catheter site.  Patient has had difficulty urinating for some hours now.  He had a procedure done at an [facility] yesterday.  Foley was left in place.        Home Medications Prior to Admission medications   Medication Sig Start Date End Date Taking? Authorizing Provider  amLODipine (NORVASC) 5 MG tablet Take 1 tablet (5 mg total) by mouth daily. 09/26/18 09/26/19  Leatha Gilding, MD  dexamethasone (DECADRON) 6 MG tablet Take 1 tablet (6 mg total) by mouth daily. 09/26/18   Leatha Gilding, MD  guaiFENesin (ROBITUSSIN) 100 MG/5ML SOLN Take 5 mLs (100 mg total) by mouth every 4 (four) hours. 09/26/18   Leatha Gilding, MD  metFORMIN (GLUCOPHAGE-XR) 500 MG 24 hr tablet Take 1,000 mg by mouth 2 (two) times daily with a meal. 12/03/17   [provider]  gabapentin (NEURONTIN) 400 MG capsule Take 1 capsule (400 mg total) by mouth 3 (three) times daily. Patient not taking: Reported on 04/06/2018 01/29/17 09/23/18  Ditty, Loura Halt, MD  omeprazole (PRILOSEC) 20 MG capsule Take 1 capsule (20 mg total) by mouth 2 (two) times daily before a meal. Patient not taking: Reported on 04/06/2018 03/01/17 09/23/18  Fayrene Helper, PA-C      Allergies    Nsaids, Shellfish-derived products, Aspirin, Meloxicam, Tramadol, and Gabapentin    Review of Systems   Review of Systems  Physical Exam Updated Vital Signs BP (!) 171/82   Pulse (!) 101   Temp 99.2 F (37.3 C) (Oral)   Resp (!) 23   SpO2 90%  Physical Exam Vitals and nursing note reviewed.  Constitutional:      Appearance: He is well-developed.  HENT:     Head: Normocephalic and atraumatic.  Eyes:     Pupils: Pupils are  equal, round, and reactive to light.  Neck:     Vascular: No JVD.  Cardiovascular:     Rate and Rhythm: Normal rate and regular rhythm.     Heart sounds: No murmur heard.    No friction rub. No gallop.  Pulmonary:     Effort: No respiratory distress.     Breath sounds: No wheezing.  Abdominal:     General: There is no distension.     Tenderness: There is no abdominal tenderness. There is no guarding or rebound.  Genitourinary:    Comments: Foley catheter in place.  Some blood-tinged urine in the catheter.  Palpable bladder. Musculoskeletal:        General: Normal range of motion.     Cervical back: Normal range of motion and neck supple.  Skin:    Coloration: Skin is not pale.     Findings: No rash.  Neurological:     Mental Status: He is alert and oriented to person, place, and time.  Psychiatric:        Behavior: Behavior normal.     ED Results / Procedures / Treatments   Labs (all labs ordered are listed, but only abnormal results are displayed) Labs Reviewed - No data to display  EKG None  Radiology No results found.  Procedures Procedures    Medications  Ordered in ED Medications  fentaNYL (SUBLIMAZE) injection 150 mcg (150 mcg Intravenous Given 09/13/22 1814)  midazolam (VERSED) injection 0.5 mg (0.5 mg Intravenous Given 09/13/22 1815)    ED Course/ Medical Decision Making/ A&P                             Medical Decision Making Risk Prescription drug management.   67 yo M with a chief complaints of difficulty urinating.  Had what sounds like a TURP yesterday and had a Foley placed.  Has had difficulty urinating today.  Will try to troubleshoot Foley.  If unable we will try to replace.  Patient's balloon was found to be deflated.  It was very carefully advanced and the patient began to have significant urine output.  Reinflated without issue.  Patient reassessed and feeling much better.  He would like to go home.  Have him follow-up with his  urologist.  7:09 PM:  I have discussed the diagnosis/risks/treatment options with the patient and family.  Evaluation and diagnostic testing in the emergency department does not suggest an emergent condition requiring admission or immediate intervention beyond what has been performed at this time.  They will follow up with Urology. We also discussed returning to the ED immediately if new or worsening sx occur. We discussed the sx which are most concerning (e.g., sudden worsening pain, fever, inability to tolerate by mouth) that necessitate immediate return. Medications administered to the patient during their visit and any new prescriptions provided to the patient are listed below.  Medications given during this visit Medications  fentaNYL (SUBLIMAZE) injection 150 mcg (150 mcg Intravenous Given 09/13/22 1814)  midazolam (VERSED) injection 0.5 mg (0.5 mg Intravenous Given 09/13/22 1815)     The patient appears reasonably screen and/or stabilized for discharge and I doubt any other medical condition or other [facility] requiring further screening, evaluation, or treatment in the ED at this time prior to discharge.          Final Clinical Impression(s) / ED Diagnoses Final diagnoses:  Urinary catheter complication, initial encounter [place] Rodriguez)    Rx / DC Orders ED Discharge Orders     None         Melene Plan, DO 09/13/22 1909

## 2024-02-29 ENCOUNTER — Emergency Department (HOSPITAL_BASED_OUTPATIENT_CLINIC_OR_DEPARTMENT_OTHER)
Admission: EM | Admit: 2024-02-29 | Discharge: 2024-02-29 | Disposition: A | Discharge status: Home / Self Care | Attending: Emergency Medicine | Admitting: Emergency Medicine

## 2024-02-29 ENCOUNTER — Encounter (HOSPITAL_BASED_OUTPATIENT_CLINIC_OR_DEPARTMENT_OTHER): Payer: Self-pay

## 2024-02-29 DIAGNOSIS — Z79899 Other long term (current) drug therapy: Secondary | ICD-10-CM | POA: Diagnosis not present

## 2024-02-29 DIAGNOSIS — N189 Chronic kidney disease, unspecified: Secondary | ICD-10-CM | POA: Insufficient documentation

## 2024-02-29 DIAGNOSIS — E1122 Type 2 diabetes mellitus with diabetic chronic kidney disease: Secondary | ICD-10-CM | POA: Diagnosis not present

## 2024-02-29 DIAGNOSIS — I129 Hypertensive chronic kidney disease with stage 1 through stage 4 chronic kidney disease, or unspecified chronic kidney disease: Secondary | ICD-10-CM | POA: Diagnosis not present

## 2024-02-29 DIAGNOSIS — J101 Influenza due to other identified influenza virus with other respiratory manifestations: Secondary | ICD-10-CM | POA: Diagnosis not present

## 2024-02-29 DIAGNOSIS — Z7984 Long term (current) use of oral hypoglycemic drugs: Secondary | ICD-10-CM | POA: Diagnosis not present

## 2024-02-29 DIAGNOSIS — R509 Fever, unspecified: Secondary | ICD-10-CM | POA: Diagnosis present

## 2024-02-29 LAB — RESP PANEL BY RT-PCR (RSV, FLU A&B, COVID)  RVPGX2
Influenza A by PCR: POSITIVE — AB
Influenza B by PCR: NEGATIVE
Resp Syncytial Virus by PCR: NEGATIVE
SARS Coronavirus 2 by RT PCR: NEGATIVE

## 2024-02-29 MED ORDER — OSELTAMIVIR PHOSPHATE 75 MG PO CAPS: 75.0000 mg | ORAL_CAPSULE | Freq: Two times a day (BID) | ORAL | 0 refills | Status: AC

## 2024-02-29 NOTE — ED Provider Triage Note (Signed)
 Emergency Medicine Provider Triage Evaluation Note  Andre Rodriguez , a 68 y.o. male  was evaluated in triage.  Pt complains of bodyaches, dry cough for the past 3 days.  He reports subjective chills but unknown fever.    Review of Systems  Positive: As above Negative: As above  Physical Exam  There were no vitals taken for this visit. Gen:   Awake, no distress   Resp:  Normal effort  MSK:   Moves extremities without difficulty  Other:    Medical Decision Making  Medically screening exam initiated at 9:18 AM.  Appropriate orders placed.  Norleen Ku was informed that the remainder of the evaluation will be completed by another provider, this initial triage assessment does not replace that evaluation, and the importance of remaining in the ED until their evaluation is complete.     Veta Palma, PA-C 02/29/24 [phone]

## 2024-02-29 NOTE — ED Provider Notes (Signed)
 " Andre Rodriguez EMERGENCY DEPARTMENT AT [facility] Provider Note   CSN: [id number] Arrival date & time: 02/29/24  9093     History Chief Complaint  Patient presents with   URI    HPI: Andre Rodriguez is a 68 y.o. male with history pertinent for CKD, diabetes, HTN who presents complaining of numerous symptoms. Patient arrived via POV accompanied by wife.  History provided by patient.  No interpreter required during this encounter.  Patient reports that he has had 2 to 3 days of sore throat, malaise, fatigue, myalgias, dry cough, reports chills, does not believe if he has had any fever.  Denies chest pain, shortness of breath, nausea, vomiting, diarrhea.  Patient's recorded medical, surgical, social, medication list and allergies were reviewed in the Snapshot window as part of the initial history.   Prior to Admission medications  Medication Sig Start Date End Date Taking? Authorizing Provider  oseltamivir  (TAMIFLU ) 75 MG capsule Take 1 capsule (75 mg total) by mouth every 12 (twelve) hours. 02/29/24  Yes Rogelia Jerilynn RAMAN, MD  amLODipine  (NORVASC ) 5 MG tablet Take 1 tablet (5 mg total) by mouth daily. 09/26/18 09/26/19  Gherghe, Costin M, MD  dexamethasone  (DECADRON ) 6 MG tablet Take 1 tablet (6 mg total) by mouth daily. 09/26/18   Gherghe, Costin M, MD  guaiFENesin  (ROBITUSSIN) 100 MG/5ML SOLN Take 5 mLs (100 mg total) by mouth every 4 (four) hours. 09/26/18   Gherghe, Costin M, MD  metFORMIN  (GLUCOPHAGE -XR) 500 MG 24 hr tablet Take 1,000 mg by mouth 2 (two) times daily with a meal. 12/03/17   [provider]  gabapentin  (NEURONTIN ) 400 MG capsule Take 1 capsule (400 mg total) by mouth 3 (three) times daily. Patient not taking: Reported on 04/06/2018 01/29/17 09/23/18  Ditty, Morene Hicks, MD  omeprazole  (PRILOSEC) 20 MG capsule Take 1 capsule (20 mg total) by mouth 2 (two) times daily before a meal. Patient not taking: Reported on 04/06/2018 03/01/17 09/23/18  Nivia Colon, PA-C      Allergies: Nsaids, Shellfish protein-containing drug products, Aspirin, Meloxicam, Tramadol, and Gabapentin    Review of Systems   ROS as per HPI  Physical Exam Updated Vital Signs BP 136/86 (BP Location: Right Arm)   Pulse 89   Temp 98.6 F (37 C)   Resp 17   SpO2 98%  Physical Exam Vitals and nursing note reviewed.  Constitutional:      General: He is not in acute distress.    Appearance: He is well-developed.  HENT:     Head: Normocephalic and atraumatic.     Right Ear: Tympanic membrane normal.     Left Ear: Tympanic membrane normal.     Mouth/Throat:     Mouth: Mucous membranes are moist.     Pharynx: No oropharyngeal exudate or posterior oropharyngeal erythema.  Eyes:     Conjunctiva/sclera: Conjunctivae normal.  Cardiovascular:     Rate and Rhythm: Normal rate and regular rhythm.     Heart sounds: No murmur heard. Pulmonary:     Effort: Pulmonary effort is normal. No respiratory distress.     Breath sounds: Normal breath sounds.  Abdominal:     Palpations: Abdomen is soft.     Tenderness: There is no abdominal tenderness.  Musculoskeletal:        General: No swelling.     Cervical back: Neck supple.  Skin:    General: Skin is warm and dry.     Capillary Refill: Capillary refill takes less than 2 seconds.  Neurological:     Mental Status: He is alert.  Psychiatric:        Mood and Affect: Mood normal.     ED Course/ Medical Decision Making/ A&P    Procedures Procedures   Medications Ordered in ED Medications - No data to display  Medical Decision Making:   Andre Rodriguez is a 68 y.o. male who presents for multiple symptoms as per above.  Physical exam is pertinent for no focal abnormalities.   The differential includes but is not limited to viral illness, influenza, pneumonia, sepsis, strep pharyngitis, AOM.  Independent historian: None  External data reviewed: No pertinent external data  Labs: Ordered, Independent interpretation, and  Details: COVID/flu/RSV positive for flu A  Radiology: Not indicated No results found.  EKG/Medicine tests: Not indicated EKG Interpretation:                  Interventions: None  See the EMR for full details regarding lab and imaging results.  Patient overall well-appearing on exam, has stable vitals, normal work of breathing, no focal rhonchi, wheezing, abnormal breath sounds on exam, no evidence of-year-old pharyngitis such as tonsillomegaly or exudates on oropharyngeal exam, no AOM on evaluation of TM.  Given patient is afebrile, dry cough, no focal lung sounds, doubt pneumonia, patient is positive for influenza.  Given patient is 48 as chronic medical conditions, will prescribe Tamiflu  after discussion of risk and benefits with patient.  Discharged in stable condition with recommendations for supportive care.  Presentation is most consistent with acute uncomplicated illness with systemic symptoms  Discussion of management or test interpretations with external provider(s): Not indicated  Risk Drugs:Prescription drug management: Prescription for Tamiflu   Disposition: DISCHARGE: I believe that the patient is safe for discharge home with outpatient follow-up. Patient was informed of all pertinent physical exam, laboratory, and imaging findings.  Patient's suspected etiology of their symptom presentation was discussed with the patient and all questions were answered. We discussed following up with PCP. I provided thorough ED return precautions. The patient feels safe and comfortable with this plan.  MDM generated using voice dictation software and may contain dictation errors.  Please contact me for any clarification or with any questions.  Clinical Impression:  1. Influenza A      Discharge   Final Clinical Impression(s) / ED Diagnoses Final diagnoses:  Influenza A    Rx / DC Orders ED Discharge Orders          Ordered    oseltamivir  (TAMIFLU ) 75 MG capsule  Every 12 hours         02/29/24 1133             Rogelia Jerilynn RAMAN, MD 02/29/24 1134  "

## 2024-02-29 NOTE — Discharge Instructions (Signed)
 Andre Rodriguez  Thank you for allowing us  to take care of you today.  You came to the Emergency Department today because you had multiple symptoms over the past several days.  You were positive for influenza A here but your vitals are good, and your exam is reassuring.  We recommend Tylenol  and ibuprofen every 4-6 hours as needed for pain or fevers.  We recommend plenty of rest and fluids.  Given you are 40 and you do have comorbid conditions such as diabetes, elevated blood pressure, we are prescribing you an antiviral called Tamiflu  which will help reduce the risk of severe symptoms.  If you have new or different symptoms, particularly difficulty breathing, please come back to the emergency department for reevaluation, otherwise follow-up with your primary care doctor.  To-Do: 1. Please follow-up with your primary doctor within 1 - 2 weeks / as soon as possible.    Please return to the Emergency Department or call 911 if you experience have worsening of your symptoms, or do not get better, chest pain, shortness of breath, severe or significantly worsening pain, high fever, severe confusion, pass out or have any reason to think that you need emergency medical care.   We hope you feel better soon.   Mitzie Later, MD Department of Emergency Medicine [facility]

## 2024-02-29 NOTE — ED Triage Notes (Signed)
 He c/o body aches/cough/congestion x 3-4 days.
